# Patient Record
Sex: Female | Born: 1975 | Race: White | Hispanic: No | Marital: Married | Smoking: Never smoker
Health system: Southern US, Community
[De-identification: ages and names within clinical notes are randomized; demographics above are authoritative.]

## PROBLEM LIST (undated history)

## (undated) DIAGNOSIS — Z9889 Other specified postprocedural states: Secondary | ICD-10-CM

## (undated) DIAGNOSIS — D649 Anemia, unspecified: Secondary | ICD-10-CM

## (undated) DIAGNOSIS — K769 Liver disease, unspecified: Secondary | ICD-10-CM

## (undated) DIAGNOSIS — J302 Other seasonal allergic rhinitis: Secondary | ICD-10-CM

## (undated) DIAGNOSIS — F419 Anxiety disorder, unspecified: Secondary | ICD-10-CM

## (undated) DIAGNOSIS — A64 Unspecified sexually transmitted disease: Secondary | ICD-10-CM

## (undated) DIAGNOSIS — E079 Disorder of thyroid, unspecified: Secondary | ICD-10-CM

## (undated) DIAGNOSIS — R112 Nausea with vomiting, unspecified: Secondary | ICD-10-CM

## (undated) DIAGNOSIS — N809 Endometriosis, unspecified: Secondary | ICD-10-CM

## (undated) DIAGNOSIS — Z5189 Encounter for other specified aftercare: Secondary | ICD-10-CM

## (undated) HISTORY — DX: Encounter for other specified aftercare: Z51.89

## (undated) HISTORY — DX: Unspecified sexually transmitted disease: A64

## (undated) HISTORY — DX: Endometriosis, unspecified: N80.9

## (undated) HISTORY — DX: Anxiety disorder, unspecified: F41.9

## (undated) HISTORY — PX: DIAGNOSTIC LAPAROSCOPY: SUR761

## (undated) HISTORY — PX: ABDOMINAL HYSTERECTOMY: SHX81

## (undated) HISTORY — PX: BREAST BIOPSY: SHX20

## (undated) HISTORY — PX: THYROID SURGERY: SHX805

---

## 2005-06-16 ENCOUNTER — Other Ambulatory Visit: Admission: RE | Admit: 2005-06-16 | Discharge: 2005-06-16 | Payer: Self-pay | Admitting: Obstetrics and Gynecology

## 2005-12-27 ENCOUNTER — Ambulatory Visit (HOSPITAL_COMMUNITY): Admission: RE | Admit: 2005-12-27 | Discharge: 2005-12-27 | Payer: Self-pay | Admitting: Obstetrics and Gynecology

## 2006-03-08 ENCOUNTER — Inpatient Hospital Stay (HOSPITAL_COMMUNITY): Admission: AD | Admit: 2006-03-08 | Discharge: 2006-03-10 | Payer: Self-pay | Admitting: Obstetrics and Gynecology

## 2008-07-08 ENCOUNTER — Encounter (INDEPENDENT_AMBULATORY_CARE_PROVIDER_SITE_OTHER): Payer: Self-pay | Admitting: Obstetrics and Gynecology

## 2008-07-08 ENCOUNTER — Ambulatory Visit (HOSPITAL_COMMUNITY): Admission: RE | Admit: 2008-07-08 | Discharge: 2008-07-08 | Payer: Self-pay | Admitting: Obstetrics and Gynecology

## 2008-09-04 ENCOUNTER — Ambulatory Visit: Payer: Self-pay | Admitting: Occupational Medicine

## 2008-09-04 DIAGNOSIS — D509 Iron deficiency anemia, unspecified: Secondary | ICD-10-CM | POA: Insufficient documentation

## 2009-11-19 ENCOUNTER — Encounter: Admission: RE | Admit: 2009-11-19 | Discharge: 2009-11-19 | Payer: Self-pay | Admitting: Obstetrics and Gynecology

## 2009-12-17 ENCOUNTER — Other Ambulatory Visit: Admission: RE | Admit: 2009-12-17 | Discharge: 2009-12-17 | Payer: Self-pay | Admitting: Interventional Radiology

## 2009-12-17 ENCOUNTER — Encounter: Admission: RE | Admit: 2009-12-17 | Discharge: 2009-12-17 | Payer: Self-pay | Admitting: Surgery

## 2010-04-13 ENCOUNTER — Encounter (HOSPITAL_COMMUNITY): Admission: RE | Admit: 2010-04-13 | Discharge: 2010-06-03 | Payer: Self-pay | Admitting: Endocrinology

## 2010-07-13 ENCOUNTER — Ambulatory Visit (HOSPITAL_COMMUNITY): Admission: RE | Admit: 2010-07-13 | Discharge: 2010-07-14 | Payer: Self-pay | Admitting: Surgery

## 2010-11-17 LAB — URINE MICROSCOPIC-ADD ON

## 2010-11-17 LAB — CBC
HCT: 37.3 % (ref 36.0–46.0)
MCHC: 32.8 g/dL (ref 30.0–36.0)
Platelets: 243 10*3/uL (ref 150–400)
RDW: 17.8 % — ABNORMAL HIGH (ref 11.5–15.5)
WBC: 6.5 10*3/uL (ref 4.0–10.5)

## 2010-11-17 LAB — DIFFERENTIAL
Basophils Absolute: 0 10*3/uL (ref 0.0–0.1)
Basophils Relative: 1 % (ref 0–1)
Lymphocytes Relative: 24 % (ref 12–46)
Neutro Abs: 4.1 10*3/uL (ref 1.7–7.7)
Neutrophils Relative %: 63 % (ref 43–77)

## 2010-11-17 LAB — URINALYSIS, ROUTINE W REFLEX MICROSCOPIC
Bilirubin Urine: NEGATIVE
Glucose, UA: NEGATIVE mg/dL
Ketones, ur: NEGATIVE mg/dL
Nitrite: NEGATIVE
Specific Gravity, Urine: 1.023 (ref 1.005–1.030)
pH: 6 (ref 5.0–8.0)

## 2010-11-17 LAB — PROTIME-INR
INR: 1.02 (ref 0.00–1.49)
Prothrombin Time: 13.6 seconds (ref 11.6–15.2)

## 2010-11-17 LAB — SURGICAL PCR SCREEN
MRSA, PCR: NEGATIVE
Staphylococcus aureus: NEGATIVE

## 2010-11-17 LAB — BASIC METABOLIC PANEL
BUN: 18 mg/dL (ref 6–23)
Calcium: 9.5 mg/dL (ref 8.4–10.5)
GFR calc non Af Amer: >60 mL/min (ref >60)
Glucose, Bld: 87 mg/dL (ref 70–99)
Potassium: 4.2 mEq/L (ref 3.5–5.1)

## 2011-01-12 ENCOUNTER — Other Ambulatory Visit: Payer: Self-pay | Admitting: Obstetrics and Gynecology

## 2011-01-18 NOTE — Op Note (Signed)
NAME:  Valerie [name], Valerie [name]              ACCOUNT NO.:  [id number]   MEDICAL RECORD NO.:  [id number]          PATIENT TYPE:  AMB   LOCATION:  SDC                           FACILITY:  [facility]   PHYSICIAN:  Randye Lobo, M.D.   DATE OF BIRTH:  05/15/1976   DATE OF PROCEDURE:  07/08/2008  DATE OF DISCHARGE:                               OPERATIVE REPORT   PREOPERATIVE DIAGNOSES:  1. Dysmenorrhea.  2. Menorrhagia.  3. Complex left ovarian cyst.  4. Desire for permanent sterilization.   POSTOPERATIVE DIAGNOSES:  1. Dysmenorrhea.  2. Menorrhagia.  3. Complex left ovarian cyst.  4. Desire for permanent sterilization.   PROCEDURE:  Hysteroscopy, dilation and curettage, NovaSure endometrial  ablation, laparoscopic left ovarian cystectomy, fulguration of  endometriosis, bilateral tubal ligation using bipolar cautery.   SURGEON:  Randye Lobo, MD   ASSISTANT:  Luvenia Redden, MD   ANESTHESIA:  General endotracheal, local paracervical block with 1%  lidocaine, local 0.25% Marcaine to skin incisions.   IV FLUIDS:  1700 mL of Ringer's lactate.   ESTIMATED BLOOD LOSS:  Minimal.   URINE OUTPUT:  125 mL.   LACTATED RINGERS DEFICIT FOR HYSTEROSCOPY:  100 mL   COMPLICATIONS:  None.   INDICATIONS FOR PROCEDURE:  The patient is a 35 year old gravida 3, para  3 female status post expulsion of Mirena IUD in May 2009, who presented  with heavy and painful menses.  The patient presented to an outside  facility with the above complaints and she was noted to have a  hemoglobin of 7.1 for which she underwent a transfusion of 2 units of  packed red blood cells.  The patient subsequently presented to the  office for further evaluation and treatment.  She had an ultrasound on  June 05, 2008, which documented a normal uterus and right ovary.  There was a complex left ovarian cyst that had no abnormal blood flow by  Doppler studies.  An endometrial biopsy on June 30, 2008, documented  benign  secretory endometrium.  The patient does have a history of  endometriosis by prior laparoscopy and is status post left ovarian  cystectomy for a reported endometrioma.  The patient desires treatment  for her painful and heavy periods, and she wishes to proceed with  NovaSure endometrial ablation, permanent sterilization, and laparoscopy  for evaluation and treatment of potential endometriosis.  Risks,  benefits, and alternatives have been reviewed with the patient.  The  patient was quoted a failure rate of the tubal ligation of approximately  1 in 250 to 1 in 300, which may result in either an intrauterine or an  ectopic pregnancy.  She chooses to proceed.   Hysteroscopy demonstrated an unremarkable intrauterine cavity.  There  was no evidence of any fibroids nor endometrial polyps.   Laparoscopy demonstrated a 2.5-cm left ovarian endometrioma.  The left  ovary was adherent to the left pelvic sidewall.  The right ovary and the  bilateral fallopian tubes were unremarkable.  The uterus was noted to be  slightly enlarged.  The posterior cul-de-sac contained vesicular lesions  of endometriosis covering a  surface area approximately 2 cm in diameter.  There were powder blue lesions of endometriosis along the proximal  uterosacral ligaments bilaterally.  There was also a small Masters  window of the posterior cul-de-sac, which measured approximately 1 cm in  diameter.   In the upper abdomen, there were what appeared to be some chronic or  congenital adhesions around the cecum.  There was no evidence of any  endometriosis in the upper abdomen.  The appendix was normal.  The liver  was unremarkable as was the stomach region.   The gallbladder was not visualized well.   SPECIMENS:  Endometrial curettings were sent to Pathology separately  from the left ovarian cyst.   PROCEDURE:  The patient was re-identified in the preoperative hold area.  The patient did receive Ancef 1 g IV for  antibiotic prophylaxis.  In the  operating room, general endotracheal anesthesia was induced, and the  patient was then placed in the dorsal lithotomy position.  The abdomen  and vagina were sterilely prepped and draped.  A Foley catheter was  placed inside the bladder.  A speculum was placed inside the vagina and  a single-tooth tenaculum was placed on the anterior cervical lip.  A  paracervical block was performed in standard fashion with a total of 10  mL of 1% lidocaine.   The endocervix was sounded and then the endometrial cavity was sounded  to measure for appropriate depths for the NovaSure endometrial ablation.  The cervix was then dilated to a #21 Pratt dilator and the diagnostic  hysteroscope was inserted.  The uterine cavity was noted to be normal.  The tubal ostia were each visualized well.  The diagnostic hysteroscope  was then removed.  The NovaSure device was then placed inside the  uterine cavity.  The NovaSure instrument was seated and was rotated  properly slightly clockwise and counter clockwise and maneuvered  appropriately in order to get the proper width measurement.  The  obturator was placed next to the cervical os and the CO2 test was  performed.  Initially, this did not pass.  A tenaculum was then placed  on the posterior cervical lip, and the obturator was held next to the  cervical os and the operating hand was held down to slightly antevert  the positioning of the NovaSure device.  The patient then passed the CO2  test.  The NovaSure device was then deployed to perform the bipolar  cautery to the endometrium.  The device was then removed without  difficulty.  There were some bleeding noted along the tenaculum site,  which needed to be compressed with a ring forceps.  The Hulka tenaculum  was placed, and the remainder of the procedure was performed  laparoscopically.   A 1-cm umbilical incision was created sharply with a scalpel.  A 10-mm  trocar was  inserted directly into the peritoneal cavity without  difficulty.  The laparoscope confirmed proper placement.  A CO2  pneumoperitoneum was achieved and the patient was placed in the  Trendelenburg position.  A 5-mm incisions were created in the right and  left lower quadrants and 5-mm trocars were placed under direct  visualization of the laparoscope.  An inspection of the pelvic and  abdominal organs was performed and the findings are as noted above.  Blunt dissection was used to remove the left ovary from its adhesions to  the left pelvic sidewall.  The ovary was then grasped and initially a  monopolar scissors was used to  create an incision in the ovarian cortex.  The dissection was then carried down to the central portion of the ovary  where an endometrioma was encountered.  There was chocolaty fluid, which  came out of the cyst.  Using blunt dissection, the endometrioma was  shelled out of the surrounding ovarian tissue, and the specimen was  placed in the cul-de-sac.  The suction irrigator was used to then clean  the bed of the ovary, so that proper cautery could be performed with the  gyrus instrument to create good hemostasis.  The edges of the ovarian  cortex were similarly cauterized with the gyrus instrument.   The ureters were then identified bilaterally and the endometriosis of  the uterosacral ligaments was cauterized with the gyrus instrument after  the ureters were noted to be well out of the region of these areas.  Also, the left ovary needed to be re-cauterized close to the location of  the infundibulopelvic ligament due to some oozing here.  Again the  ureter was noted to be well out of the region of the cautery.  Hemostasis was then good.  Additional cautery needed to be performed  along the left pelvic sidewall where the ovary was adherent to the  peritoneum.  Again, these regions were treated only after the left  ureter was again re-identified.   Hemostasis was  noted to be good at this time.   The left lower quadrant incision was extended to accommodate a 10/11 mm  trocar, which was placed under visualization of the laparoscope.  The  EndoCatch was used to remove the specimen from within the peritoneal  cavity.   Final irrigation and suctioning revealed good hemostasis of the surgical  fields.  A piece of Interceed was then wrapped around the left tube and  ovary and the procedure was completed.  The lower abdominal trocars were  removed under visualization of the laparoscope.  The CO2  pneumoperitoneum was released and the laparoscope and the umbilical  trocar were removed simultaneously.   The left lower quadrant incision was closed along the fascia with a  figure-of-eight suture of 0-Vicryl.  The skin incisions were closed with  subcuticular sutures of 3-0 plain gut suture.  The skin incisions were  closed with Dermabond after injecting with 0.5% Marcaine.   The Hulka was removed from the cervix.  Again, some oozing was noted  from the Hulka tenaculum site and this did respond to compression with a  ring forceps.  The Foley catheter was removed.  The procedure was  completed with hemostasis being good.   The patient was escorted to the recovery room in stable and awake  condition.  There were no complications to the procedure.  All needle,  instrument, and sponge counts were correct.      Randye Lobo, M.D.  Electronically Signed     BES/MEDQ  D:  07/08/2008  T:  07/08/2008  Job:  [id number]

## 2011-05-04 ENCOUNTER — Other Ambulatory Visit: Payer: Self-pay | Admitting: Dermatology

## 2011-06-07 LAB — URINALYSIS, ROUTINE W REFLEX MICROSCOPIC
Glucose, UA: NEGATIVE
Ketones, ur: NEGATIVE
Leukocytes, UA: NEGATIVE
Protein, ur: NEGATIVE
Urobilinogen, UA: 0.2

## 2011-06-07 LAB — PREGNANCY, URINE: Preg Test, Ur: NEGATIVE

## 2011-06-07 LAB — CBC
Hemoglobin: 11.4 — ABNORMAL LOW
MCHC: 32.1
Platelets: 254
RDW: 33 — ABNORMAL HIGH

## 2011-06-07 LAB — URINE MICROSCOPIC-ADD ON

## 2011-09-06 DIAGNOSIS — Z5189 Encounter for other specified aftercare: Secondary | ICD-10-CM

## 2011-09-06 HISTORY — DX: Encounter for other specified aftercare: Z51.89

## 2011-10-09 IMAGING — CR DG CHEST 2V
2 series · 2 of 2 positions shown · non-contrast
Comparison: None.

CLINICAL DATA: Thyroid nodule.  Preop respiratory exam.

CHEST - 2 VIEW

[w chest pa]
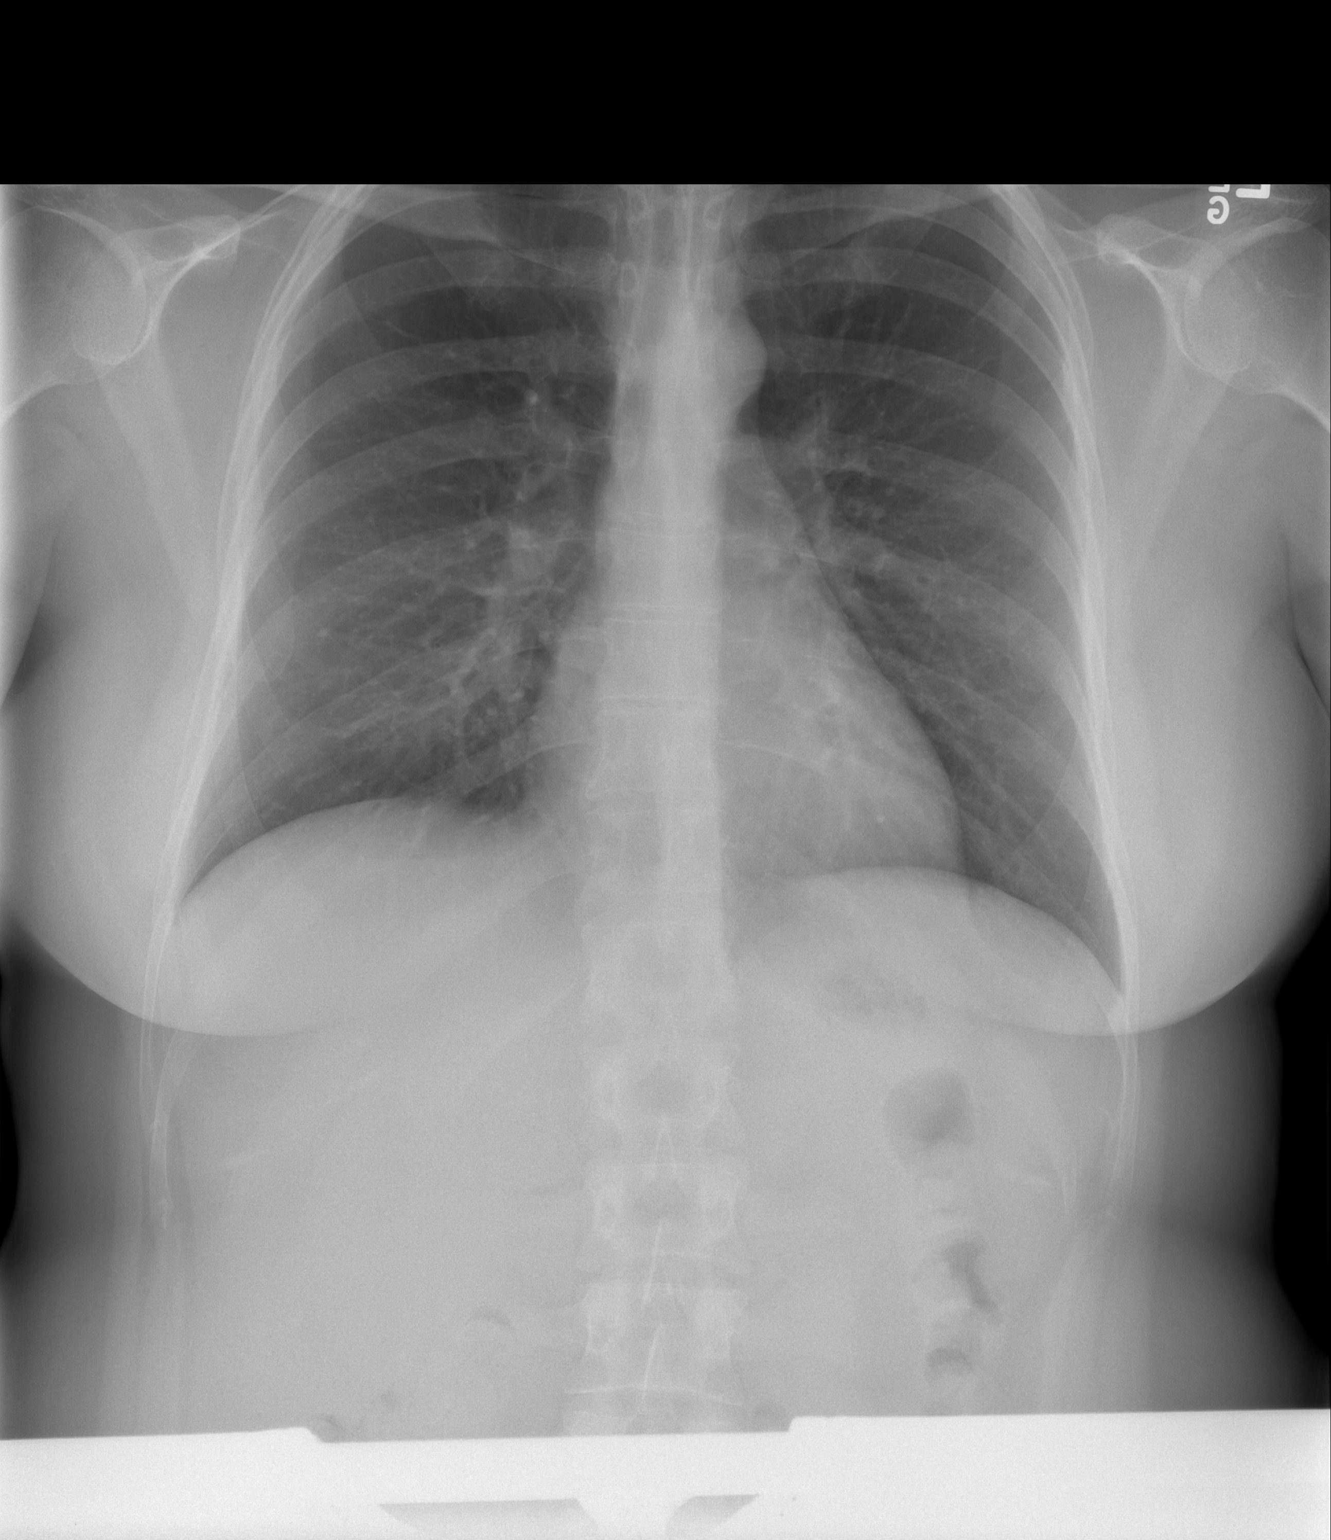

[w chest lat]
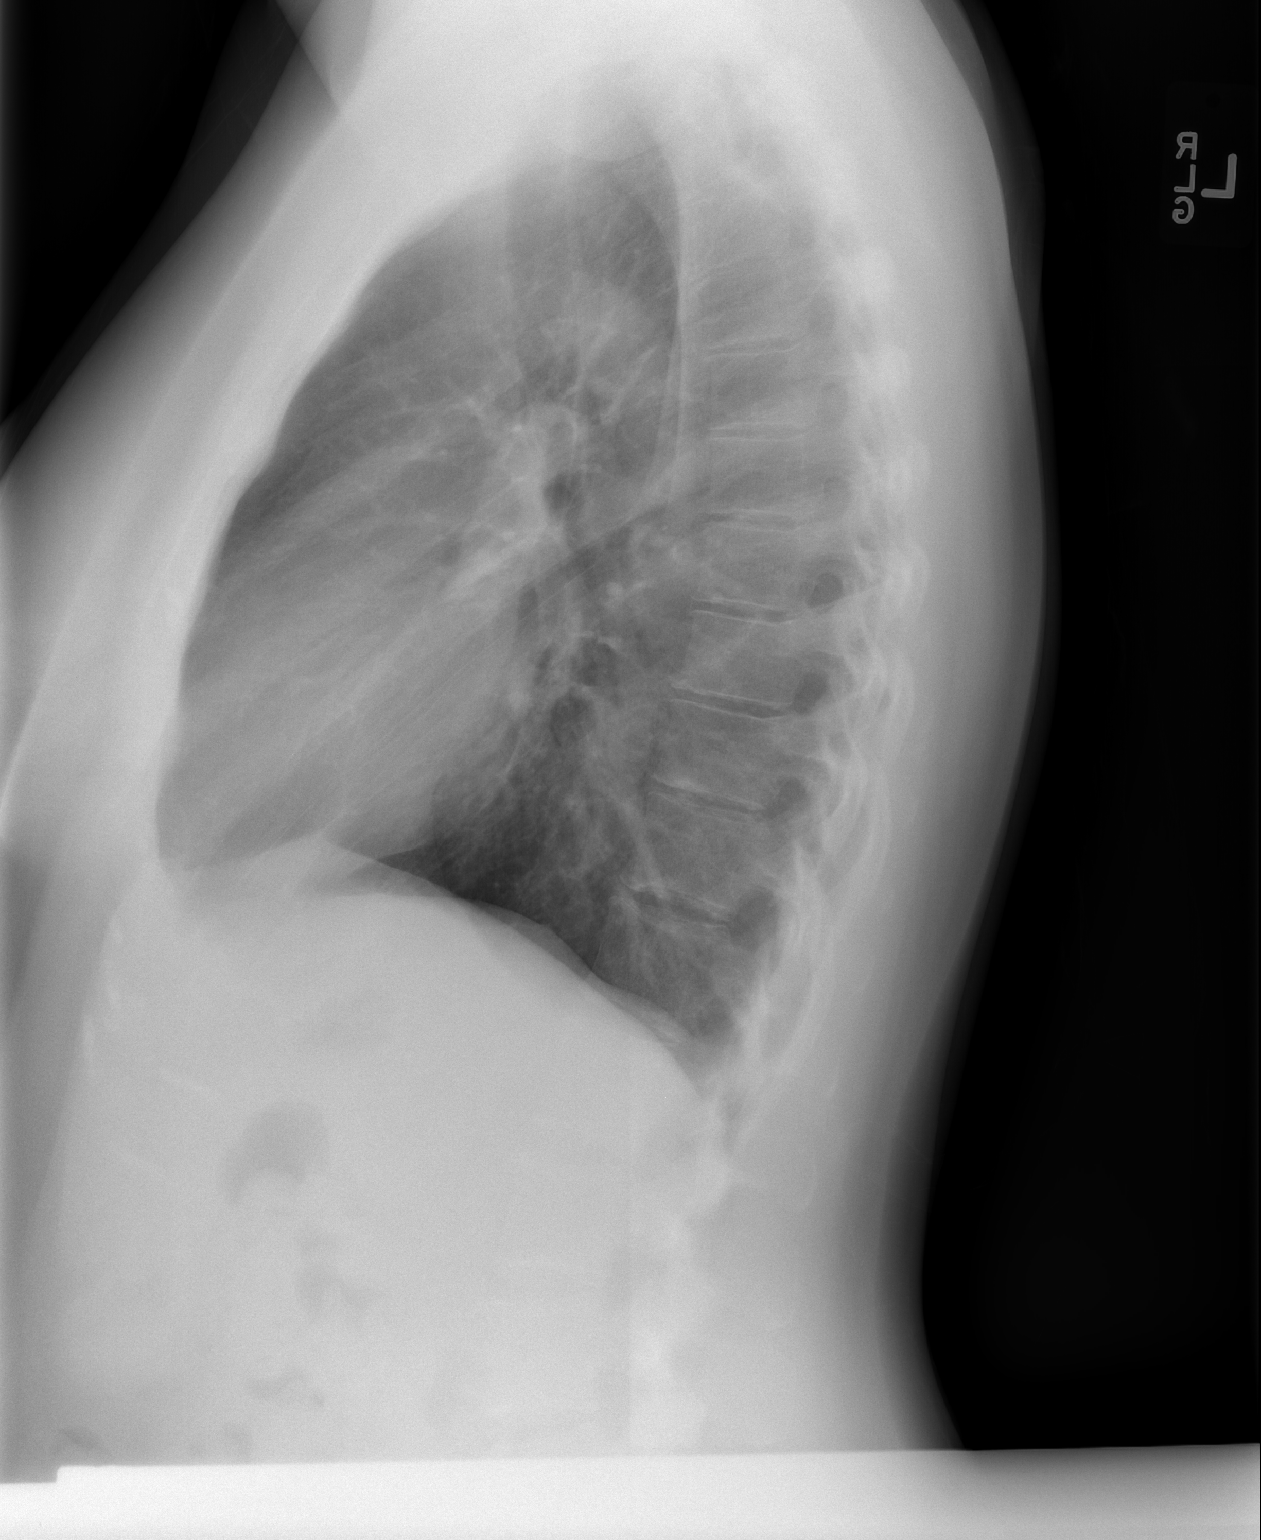

[2 of 2 positions shown; findings below may reference images not displayed]

FINDINGS: The heart size and mediastinal contours are within
normal limits.  Both lungs are clear.  The visualized skeletal
structures are unremarkable.
IMPRESSION: No active cardiopulmonary disease.

## 2012-03-23 ENCOUNTER — Other Ambulatory Visit: Payer: Self-pay | Admitting: Obstetrics and Gynecology

## 2012-04-18 ENCOUNTER — Encounter (HOSPITAL_COMMUNITY): Payer: Self-pay | Admitting: Pharmacist

## 2012-04-20 ENCOUNTER — Other Ambulatory Visit: Payer: Self-pay | Admitting: Obstetrics and Gynecology

## 2012-04-27 ENCOUNTER — Encounter (HOSPITAL_COMMUNITY)
Admission: RE | Admit: 2012-04-27 | Discharge: 2012-04-27 | Disposition: A | Discharge status: Home / Self Care | Payer: Managed Care, Other (non HMO) | Source: Ambulatory Visit | Attending: Obstetrics and Gynecology | Admitting: Obstetrics and Gynecology

## 2012-04-27 ENCOUNTER — Encounter (HOSPITAL_COMMUNITY): Payer: Self-pay

## 2012-04-27 HISTORY — DX: Nausea with vomiting, unspecified: R11.2

## 2012-04-27 HISTORY — DX: Other specified postprocedural states: Z98.890

## 2012-04-27 HISTORY — DX: Anemia, unspecified: D64.9

## 2012-04-27 HISTORY — DX: Liver disease, unspecified: K76.9

## 2012-04-27 LAB — SURGICAL PCR SCREEN
MRSA, PCR: INVALID — AB
Staphylococcus aureus: INVALID — AB

## 2012-04-27 LAB — CBC
MCH: 29.7 pg (ref 26.0–34.0)
MCHC: 32.4 g/dL (ref 30.0–36.0)
MCV: 91.6 fL (ref 78.0–100.0)
Platelets: 189 10*3/uL (ref 150–400)
RBC: 4.41 MIL/uL (ref 3.87–5.11)

## 2012-04-27 NOTE — Patient Instructions (Addendum)
20 Valerie [name]  04/27/2012   Your procedure is scheduled on:  05/03/12  Enter through the Main Entrance of [facility] at 1030 AM.  Pick up the phone at the desk and dial [phone].   Call this number if you have problems the morning of surgery: [phone]   Remember:   Do not eat food:After Midnight.  Do not drink clear liquids: After Midnight.  Take these medicines the morning of surgery with A SIP OF WATER: Thyroid medication   Do not wear jewelry, make-up or nail polish.  Do not wear lotions, powders, or perfumes. You may wear deodorant.  Do not shave 48 hours prior to surgery.  Do not bring valuables to the hospital.  Contacts, dentures or bridgework may not be worn into surgery.  Leave suitcase in the car. After surgery it may be brought to your room.  For patients admitted to the hospital, checkout time is 11:00 AM the day of discharge.   Patients discharged the day of surgery will not be allowed to drive home.  Name and phone number of your driver: NA  Special Instructions: CHG Shower Use Special Wash: 1/2 bottle night before surgery and 1/2 bottle morning of surgery.   Please read over the following fact sheets that you were given: MRSA Information

## 2012-04-30 LAB — MRSA CULTURE

## 2012-05-02 NOTE — H&P (Signed)
36 y.o. yo complains of continued pelvic pain and menorrhagia.  Pt is s/p BTL/novasure and continues to have constant pain R & L LQ.  She also has bleeding for two weeks at a time, 2 days of which are flooding and changing clothes.  She had been transfused with iron in February.  She has tried the [place], Depo, and Novasure without relief.  She does not complain of SUI.  She has a history of endometriosis and possible adenomyosis.    EMB had been negative.  Past Medical History  Diagnosis Date  . PONV (postoperative nausea and vomiting)   . Liver disorder     fatty liver disease  . Anemia     Hx blood transfusion 3-36yrs ago and iron infusion last month 03/2012   Past Surgical History  Procedure Date  . Thyroid surgery     removed  . Diagnostic laparoscopy     History   Social History  . Marital Status: Married    Spouse Name: N/A    Number of Children: N/A  . Years of Education: N/A   Occupational History  . Not on file.   Social History Main Topics  . Smoking status: Never Smoker   . Smokeless tobacco: Not on file  . Alcohol Use: Yes     rarely  . Drug Use: No  . Sexually Active:    Other Topics Concern  . Not on file   Social History Narrative  . No narrative on file    No current facility-administered medications on file prior to encounter.   Current Outpatient Prescriptions on File Prior to Encounter  Medication Sig Dispense Refill  . levothyroxine (SYNTHROID, LEVOTHROID) 88 MCG tablet Take 88 mcg by mouth daily.        Allergies  Allergen Reactions  . Vicodin (Hydrocodone-Acetaminophen) Nausea And Vomiting    Patient can tolerate percocet    @VITALS2 @  Lungs: clear to ascultation Cor:  RRR Abdomen:  soft, nontender, nondistended. Ex:  no cords, erythema Pelvic:  10 weeks size uterus; not other masses felt.  U/S  9.5x6.5x6.4, EM 1.4 cm; ovaries normal.  Adenomyosis appearance of uterus.  A:  For Robo TLH/RSO (pain worse on right.) possible LSO for  adenomyosis and menorrhagia; R&L LQ pain.   P:All risks, benefits and alternatives d/w patient and she desires to proceed.  Patient has undergone a modified bowel prep and will receive preop antibiotics and SCDs during the operation.     Tong Pieczynski A

## 2012-05-03 ENCOUNTER — Encounter (HOSPITAL_COMMUNITY): Payer: Self-pay | Admitting: Anesthesiology

## 2012-05-03 ENCOUNTER — Ambulatory Visit (HOSPITAL_COMMUNITY)
Admission: RE | Admit: 2012-05-03 | Discharge: 2012-05-04 | Disposition: A | Discharge status: Home / Self Care | Payer: Managed Care, Other (non HMO) | Source: Ambulatory Visit | Attending: Obstetrics and Gynecology | Admitting: Obstetrics and Gynecology

## 2012-05-03 ENCOUNTER — Encounter (HOSPITAL_COMMUNITY)
Admission: RE | Disposition: A | Discharge status: Home / Self Care | Payer: Self-pay | Source: Ambulatory Visit | Attending: Obstetrics and Gynecology

## 2012-05-03 ENCOUNTER — Ambulatory Visit (HOSPITAL_COMMUNITY): Payer: Managed Care, Other (non HMO) | Admitting: Anesthesiology

## 2012-05-03 DIAGNOSIS — N8300 Follicular cyst of ovary, unspecified side: Secondary | ICD-10-CM | POA: Insufficient documentation

## 2012-05-03 DIAGNOSIS — N949 Unspecified condition associated with female genital organs and menstrual cycle: Secondary | ICD-10-CM | POA: Insufficient documentation

## 2012-05-03 DIAGNOSIS — Z9889 Other specified postprocedural states: Secondary | ICD-10-CM

## 2012-05-03 DIAGNOSIS — N8000 Endometriosis of the uterus, unspecified: Secondary | ICD-10-CM | POA: Insufficient documentation

## 2012-05-03 DIAGNOSIS — R1031 Right lower quadrant pain: Secondary | ICD-10-CM | POA: Insufficient documentation

## 2012-05-03 DIAGNOSIS — N920 Excessive and frequent menstruation with regular cycle: Secondary | ICD-10-CM | POA: Insufficient documentation

## 2012-05-03 DIAGNOSIS — R1032 Left lower quadrant pain: Secondary | ICD-10-CM | POA: Insufficient documentation

## 2012-05-03 HISTORY — PX: CYSTOSCOPY: SHX5120

## 2012-05-03 HISTORY — PX: SALPINGOOPHORECTOMY: SHX82

## 2012-05-03 LAB — HEPATIC FUNCTION PANEL
AST: 47 U/L — ABNORMAL HIGH (ref 0–37)
Albumin: 4.6 g/dL (ref 3.5–5.2)
Total Bilirubin: 0.6 mg/dL (ref 0.3–1.2)

## 2012-05-03 LAB — PREGNANCY, URINE: Preg Test, Ur: NEGATIVE

## 2012-05-03 SURGERY — ROBOTIC ASSISTED TOTAL HYSTERECTOMY
Anesthesia: General | Wound class: Clean Contaminated

## 2012-05-03 MED ORDER — FENTANYL CITRATE 0.05 MG/ML IJ SOLN
INTRAMUSCULAR | Status: DC | PRN
  Administered 2012-05-03 (×2): 50 ug via INTRAVENOUS
  Administered 2012-05-03: 100 ug via INTRAVENOUS
  Administered 2012-05-03: 50 ug via INTRAVENOUS
  Administered 2012-05-03: 100 ug via INTRAVENOUS
  Administered 2012-05-03: 50 ug via INTRAVENOUS

## 2012-05-03 MED ORDER — CEFAZOLIN SODIUM-DEXTROSE 2-3 GM-% IV SOLR
2.0000 g | INTRAVENOUS | Status: AC
  Administered 2012-05-03: 2 g via INTRAVENOUS

## 2012-05-03 MED ORDER — ROCURONIUM BROMIDE 100 MG/10ML IV SOLN
INTRAVENOUS | Status: DC | PRN
  Administered 2012-05-03 (×3): 10 mg via INTRAVENOUS
  Administered 2012-05-03: 40 mg via INTRAVENOUS

## 2012-05-03 MED ORDER — LORATADINE 10 MG PO TABS
10.0000 mg | ORAL_TABLET | Freq: Every day | ORAL | Status: DC
  Filled 2012-05-03: qty 1

## 2012-05-03 MED ORDER — LACTATED RINGERS IR SOLN
Status: DC | PRN
  Administered 2012-05-03: 3000 mL

## 2012-05-03 MED ORDER — DEXAMETHASONE SODIUM PHOSPHATE 4 MG/ML IJ SOLN
INTRAMUSCULAR | Status: DC | PRN
  Administered 2012-05-03: 10 mg via INTRAVENOUS

## 2012-05-03 MED ORDER — LACTATED RINGERS IV SOLN
INTRAVENOUS | Status: DC
  Administered 2012-05-03 (×2): via INTRAVENOUS

## 2012-05-03 MED ORDER — MENTHOL 3 MG MT LOZG: 1.0000 | LOZENGE | OROMUCOSAL | Status: DC | PRN

## 2012-05-03 MED ORDER — ONDANSETRON HCL 4 MG/2ML IJ SOLN
INTRAMUSCULAR | Status: AC
  Filled 2012-05-03: qty 2

## 2012-05-03 MED ORDER — LIDOCAINE HCL (CARDIAC) 20 MG/ML IV SOLN
INTRAVENOUS | Status: DC | PRN
  Administered 2012-05-03: 60 mg via INTRAVENOUS

## 2012-05-03 MED ORDER — DEXAMETHASONE SODIUM PHOSPHATE 10 MG/ML IJ SOLN
INTRAMUSCULAR | Status: AC
  Filled 2012-05-03: qty 1

## 2012-05-03 MED ORDER — HYDROMORPHONE HCL PF 1 MG/ML IJ SOLN
0.2500 mg | INTRAMUSCULAR | Status: DC | PRN
  Administered 2012-05-03: 0.5 mg via INTRAVENOUS

## 2012-05-03 MED ORDER — OXYCODONE-ACETAMINOPHEN 5-325 MG PO TABS
1.0000 | ORAL_TABLET | ORAL | Status: DC | PRN
  Administered 2012-05-03 – 2012-05-04 (×5): 1 via ORAL
  Filled 2012-05-03: qty 1
  Filled 2012-05-03: qty 2
  Filled 2012-05-03: qty 1
  Filled 2012-05-03: qty 2
  Filled 2012-05-03: qty 1

## 2012-05-03 MED ORDER — PROPOFOL 10 MG/ML IV EMUL
INTRAVENOUS | Status: AC
  Filled 2012-05-03: qty 20

## 2012-05-03 MED ORDER — INDIGOTINDISULFONATE SODIUM 8 MG/ML IJ SOLN
INTRAMUSCULAR | Status: DC | PRN
  Administered 2012-05-03: 5 mL via INTRAVENOUS

## 2012-05-03 MED ORDER — IBUPROFEN 800 MG PO TABS
800.0000 mg | ORAL_TABLET | Freq: Three times a day (TID) | ORAL | Status: DC | PRN
  Filled 2012-05-03: qty 1

## 2012-05-03 MED ORDER — PROPOFOL 10 MG/ML IV EMUL
INTRAVENOUS | Status: DC | PRN
  Administered 2012-05-03: 160 mg via INTRAVENOUS

## 2012-05-03 MED ORDER — LEVOTHYROXINE SODIUM 88 MCG PO TABS
88.0000 ug | ORAL_TABLET | Freq: Every day | ORAL | Status: DC
  Filled 2012-05-03 (×2): qty 1

## 2012-05-03 MED ORDER — HYDROMORPHONE HCL PF 1 MG/ML IJ SOLN
INTRAMUSCULAR | Status: AC
  Filled 2012-05-03: qty 1

## 2012-05-03 MED ORDER — ARTIFICIAL TEARS OP OINT
TOPICAL_OINTMENT | OPHTHALMIC | Status: AC
  Filled 2012-05-03: qty 3.5

## 2012-05-03 MED ORDER — NEOSTIGMINE METHYLSULFATE 1 MG/ML IJ SOLN
INTRAMUSCULAR | Status: AC
  Filled 2012-05-03: qty 10

## 2012-05-03 MED ORDER — KETOROLAC TROMETHAMINE 30 MG/ML IJ SOLN
30.0000 mg | Freq: Four times a day (QID) | INTRAMUSCULAR | Status: DC
  Administered 2012-05-03: 30 mg via INTRAVENOUS

## 2012-05-03 MED ORDER — ONDANSETRON HCL 4 MG/2ML IJ SOLN
INTRAMUSCULAR | Status: DC | PRN
  Administered 2012-05-03: 4 mg via INTRAVENOUS

## 2012-05-03 MED ORDER — ZOLPIDEM TARTRATE 5 MG PO TABS: 5.0000 mg | ORAL_TABLET | Freq: Every evening | ORAL | Status: DC | PRN

## 2012-05-03 MED ORDER — GLYCOPYRROLATE 0.2 MG/ML IJ SOLN
INTRAMUSCULAR | Status: AC
  Filled 2012-05-03: qty 1

## 2012-05-03 MED ORDER — MIDAZOLAM HCL 2 MG/2ML IJ SOLN
INTRAMUSCULAR | Status: AC
  Filled 2012-05-03: qty 2

## 2012-05-03 MED ORDER — FENTANYL CITRATE 0.05 MG/ML IJ SOLN
INTRAMUSCULAR | Status: AC
  Filled 2012-05-03: qty 5

## 2012-05-03 MED ORDER — KETOROLAC TROMETHAMINE 30 MG/ML IJ SOLN
INTRAMUSCULAR | Status: AC
  Filled 2012-05-03: qty 1

## 2012-05-03 MED ORDER — ONDANSETRON HCL 4 MG PO TABS: 4.0000 mg | ORAL_TABLET | Freq: Four times a day (QID) | ORAL | Status: DC | PRN

## 2012-05-03 MED ORDER — ATROPINE SULFATE 0.4 MG/ML IJ SOLN
INTRAMUSCULAR | Status: DC | PRN
  Administered 2012-05-03: 0.4 mg via INTRAVENOUS

## 2012-05-03 MED ORDER — ONDANSETRON HCL 4 MG/2ML IJ SOLN
4.0000 mg | Freq: Four times a day (QID) | INTRAMUSCULAR | Status: DC | PRN
  Administered 2012-05-03: 4 mg via INTRAVENOUS
  Filled 2012-05-03: qty 2

## 2012-05-03 MED ORDER — GLYCOPYRROLATE 0.2 MG/ML IJ SOLN
INTRAMUSCULAR | Status: DC | PRN
  Administered 2012-05-03: 0.2 mg via INTRAVENOUS

## 2012-05-03 MED ORDER — BUTORPHANOL TARTRATE 1 MG/ML IJ SOLN
2.0000 mg | INTRAMUSCULAR | Status: DC | PRN
Start: 2012-05-03 — End: 2012-05-04
  Filled 2012-05-03: qty 2

## 2012-05-03 MED ORDER — ROCURONIUM BROMIDE 50 MG/5ML IV SOLN
INTRAVENOUS | Status: AC
  Filled 2012-05-03: qty 2

## 2012-05-03 MED ORDER — NEOSTIGMINE METHYLSULFATE 1 MG/ML IJ SOLN
INTRAMUSCULAR | Status: DC | PRN
  Administered 2012-05-03: 5 mg via INTRAVENOUS

## 2012-05-03 MED ORDER — MIDAZOLAM HCL 5 MG/5ML IJ SOLN
INTRAMUSCULAR | Status: DC | PRN
  Administered 2012-05-03: 2 mg via INTRAVENOUS

## 2012-05-03 MED ORDER — LIDOCAINE HCL (CARDIAC) 20 MG/ML IV SOLN
INTRAVENOUS | Status: AC
Start: 2012-05-03 — End: 2012-05-03
  Filled 2012-05-03: qty 5

## 2012-05-03 MED ORDER — CEFAZOLIN SODIUM-DEXTROSE 2-3 GM-% IV SOLR
INTRAVENOUS | Status: AC
  Filled 2012-05-03: qty 50

## 2012-05-03 SURGICAL SUPPLY — 76 items
ADH SKN CLS APL DERMABOND .7 (GAUZE/BANDAGES/DRESSINGS) ×2
APL SKNCLS STERI-STRIP NONHPOA (GAUZE/BANDAGES/DRESSINGS) ×2
BAG URINE DRAINAGE (UROLOGICAL SUPPLIES) ×3 IMPLANT
BARRIER ADHS 3X4 INTERCEED (GAUZE/BANDAGES/DRESSINGS) ×3 IMPLANT
BENZOIN TINCTURE PRP APPL 2/3 (GAUZE/BANDAGES/DRESSINGS) ×3 IMPLANT
BRR ADH 4X3 ABS CNTRL BYND (GAUZE/BANDAGES/DRESSINGS) ×2
CABLE HIGH FREQUENCY MONO STRZ (ELECTRODE) ×3 IMPLANT
CATH FOLEY 3WAY  5CC 16FR (CATHETERS) ×1
CATH FOLEY 3WAY 5CC 16FR (CATHETERS) ×2 IMPLANT
CHLORAPREP W/TINT 26ML (MISCELLANEOUS) ×3 IMPLANT
CLOTH BEACON ORANGE TIMEOUT ST (SAFETY) ×3 IMPLANT
CONT PATH 16OZ SNAP LID 3702 (MISCELLANEOUS) ×3 IMPLANT
COVER MAYO STAND STRL (DRAPES) ×3 IMPLANT
COVER TABLE BACK 60X90 (DRAPES) ×6 IMPLANT
COVER TIP SHEARS 8 DVNC (MISCELLANEOUS) ×2 IMPLANT
COVER TIP SHEARS 8MM DA VINCI (MISCELLANEOUS) ×1
DECANTER SPIKE VIAL GLASS SM (MISCELLANEOUS) ×3 IMPLANT
DERMABOND ADVANCED (GAUZE/BANDAGES/DRESSINGS) ×1
DERMABOND ADVANCED .7 DNX12 (GAUZE/BANDAGES/DRESSINGS) ×2 IMPLANT
DRAPE HUG U DISPOSABLE (DRAPE) ×3 IMPLANT
DRAPE LG THREE QUARTER DISP (DRAPES) ×6 IMPLANT
DRAPE MONITOR DA VINCI (DRAPE) IMPLANT
DRAPE WARM FLUID 44X44 (DRAPE) ×3 IMPLANT
ELECT REM PT RETURN 9FT ADLT (ELECTROSURGICAL) ×3
ELECTRODE REM PT RTRN 9FT ADLT (ELECTROSURGICAL) ×2 IMPLANT
EVACUATOR SMOKE 8.L (FILTER) ×3 IMPLANT
GAUZE VASELINE 3X9 (GAUZE/BANDAGES/DRESSINGS) IMPLANT
GLOVE BIO SURGEON STRL SZ 6.5 (GLOVE) ×3 IMPLANT
GLOVE BIO SURGEON STRL SZ7 (GLOVE) ×9 IMPLANT
GLOVE BIOGEL PI IND STRL 6.5 (GLOVE) ×2 IMPLANT
GLOVE BIOGEL PI INDICATOR 6.5 (GLOVE) ×1
GLOVE ECLIPSE 6.5 STRL STRAW (GLOVE) ×9 IMPLANT
GOWN STRL REIN XL XLG (GOWN DISPOSABLE) ×18 IMPLANT
GYRUS RUMI II 2.5CM BLUE (DISPOSABLE)
GYRUS RUMI II 3.5CM BLUE (DISPOSABLE)
GYRUS RUMI II 4.0CM BLUE (DISPOSABLE)
KIT ACCESSORY DA VINCI DISP (KITS) ×1
KIT ACCESSORY DVNC DISP (KITS) ×2 IMPLANT
KIT DISP ACCESSORY 4 ARM (KITS) IMPLANT
MANIPULATOR UTERINE 4.5 ZUMI (MISCELLANEOUS) IMPLANT
NDL INSUFFLATION 14GA 120MM (NEEDLE) ×2 IMPLANT
NEEDLE INSUFFLATION 14GA 120MM (NEEDLE) ×3 IMPLANT
OCCLUDER COLPOPNEUMO (BALLOONS) ×6 IMPLANT
PACK LAVH (CUSTOM PROCEDURE TRAY) ×3 IMPLANT
PAD PREP 24X48 CUFFED NSTRL (MISCELLANEOUS) ×6 IMPLANT
PLUG CATH AND CAP STER (CATHETERS) ×3 IMPLANT
PROTECTOR NERVE ULNAR (MISCELLANEOUS) ×6 IMPLANT
RUMI II 3.0CM BLUE KOH-EFFICIE (DISPOSABLE) IMPLANT
RUMI II GYRUS 2.5CM BLUE (DISPOSABLE) IMPLANT
RUMI II GYRUS 3.5CM BLUE (DISPOSABLE) IMPLANT
RUMI II GYRUS 4.0CM BLUE (DISPOSABLE) IMPLANT
SET CYSTO W/LG BORE CLAMP LF (SET/KITS/TRAYS/PACK) IMPLANT
SET IRRIG TUBING LAPAROSCOPIC (IRRIGATION / IRRIGATOR) ×3 IMPLANT
SOLUTION ELECTROLUBE (MISCELLANEOUS) ×3 IMPLANT
SPONGE LAP 18X18 X RAY DECT (DISPOSABLE) IMPLANT
STRIP CLOSURE SKIN 1/2X4 (GAUZE/BANDAGES/DRESSINGS) ×3 IMPLANT
SURGIFLO W/THROMBIN 8M KIT (HEMOSTASIS) ×1 IMPLANT
SUT VIC AB 0 CT1 27 (SUTURE) ×15
SUT VIC AB 0 CT1 27XBRD ANBCTR (SUTURE) ×10 IMPLANT
SUT VIC AB 2-0 CT2 27 (SUTURE) ×6 IMPLANT
SUT VICRYL 0 UR6 27IN ABS (SUTURE) ×6 IMPLANT
SUT VICRYL RAPIDE 3 0 (SUTURE) ×6 IMPLANT
SYR 50ML LL SCALE MARK (SYRINGE) ×3 IMPLANT
SYSTEM CONVERTIBLE TROCAR (TROCAR) IMPLANT
TIP UTERINE 5.1X6CM LAV DISP (MISCELLANEOUS) IMPLANT
TIP UTERINE 6.7X10CM GRN DISP (MISCELLANEOUS) IMPLANT
TIP UTERINE 6.7X6CM WHT DISP (MISCELLANEOUS) IMPLANT
TIP UTERINE 6.7X8CM BLUE DISP (MISCELLANEOUS) IMPLANT
TOWEL OR 17X24 6PK STRL BLUE (TOWEL DISPOSABLE) ×6 IMPLANT
TROCAR DISP BLADELESS 8 DVNC (TROCAR) ×2 IMPLANT
TROCAR DISP BLADELESS 8MM (TROCAR) ×1
TROCAR XCEL 12X100 BLDLESS (ENDOMECHANICALS) ×3 IMPLANT
TROCAR XCEL NON-BLD 5MMX100MML (ENDOMECHANICALS) ×6 IMPLANT
TROCAR Z-THREAD 12X150 (TROCAR) IMPLANT
TUBING FILTER THERMOFLATOR (ELECTROSURGICAL) ×3 IMPLANT
WATER STERILE IRR 1000ML POUR (IV SOLUTION) ×9 IMPLANT

## 2012-05-03 NOTE — Preoperative (Signed)
Beta Blockers   Reason not to administer Beta Blockers:Not Applicable 

## 2012-05-03 NOTE — Brief Op Note (Signed)
05/03/2012  2:06 PM  PATIENT:  Valerie [name]  36 y.o. female  PRE-OPERATIVE DIAGNOSIS:  Menometrorrhagia  POST-OPERATIVE DIAGNOSIS:  Menometrorrhagia  PROCEDURE:  Procedure(s) (LRB): ROBOTIC ASSISTED TOTAL HYSTERECTOMY (N/A) R SALPINGO OOPHERECTOMY, L SALPINGECTOMY CYSTOSCOPY (N/A)  SURGEON:  Surgeon(s) and Role:    * Loney Laurence, MD - Primary    * W Lodema Hong, MD - Assisting   ANESTHESIA:   general  EBL:  Total I/O In: 1000 [I.V.:1000] Out: 150 [Urine:150]  SPECIMEN:  Source of Specimen:  uterus, cervix, R ovary, bilateral tubes  DISPOSITION OF SPECIMEN:  PATHOLOGY  COUNTS:  YES  PLAN OF CARE: Admit for overnight observation  PATIENT DISPOSITION:  PACU - hemodynamically stable.   Delay start of Pharmacological VTE agent (>24hrs) due to surgical blood loss or risk of bleeding: not applicable  Complications:  None.  Findings:  10 weeks size boggy uterus, weighing 305 gm.  Ovaries were normal bilaterally, though left was much smaller than right.  There was endometriosis on L tube.  The cul de sac appeared normal. The ureters were identified during multiple points of the case and were always out of the field of dissection.  On cystoscopy, the bladder was intact and bilateral spill was seen from each ureteral orriface.  There were adhesions of omentum to upper anterior abdominal wall which were left in place.  The appendix and liver edge were normal.  Medications:  Ancef.  Indigo carmine.  Surgiflo.    Technique:  After adequate anesthesia was achieved the patient was positioned, prepped and draped in usual sterile fashion.  A speculum was placed in the vagina and the cervix dilated with pratt dilators.  The 8 cm Rumi and 3 cm Koh ring were assembled and placed in proper fashion.  The  Speculum was removed and the bladder catheterized with a foley.    Attention was turned to the abdomen where a 1 cm incision was made above the umbilicus.  The veress needle was  inserted without aspiration of bowel contents or blood.  The long 12 mm trocar was placed and the other three trocar sites were marked out, all approximately 10 cm from each other and the camera.  Two 8.5 mm trocars were placed on either side of the camera port.  A 5 mm assistant port was placed 3 cm above the left iliac crest.  All trocars were inserted under direct visualization of the camera.  The patient was placed in trendelenburg and then the Robot docked.  The PK forceps were placed on arm 2 and the Hot shears on arm 1 and introduced under direct visualization of the camera    I then broke scrub and sat down at the console.  The above findings were noted and the ureters identified well out of the field of dissection.  The round ligament on the right was cauterized with the PK and divided with the shears.  The right IP ligament was then divided with the PK cautery and shears.  The posterior broad ligament was then divided with the hot shears until the uterosacral ligament.  The Broad and cardinal ligaments were then cauterized against the cervix to the level of the Koh ring, securing the uterine artery.  Each pedicle was then incised with the shears.  The anterior leaf was then incised at the reflection of the vessico-uterine junction and the lateral bladder retracted inferiorly after the round ligament on the left had been divided with the PK forceps.  The left tube  was cauterized with the PK and divided with the shears;  then the left utero-ovarian ligament divided with the PK forceps and the scissors.  The left broad ligament and cardinal ligaments were incised in the same way as the right.  The vessels were very big and required multiple applications of cautery.  The ureters were confirmed well away from the field of dissection at all times.   At the level of the internal os, the uterine arteries were bilaterally cauterized with the PK.  The ureters were again identified well out of the field of  dissection.  .   The bladder was then able to be retracted inferiorly and the vesico-uterine fascia was incised in the midline until the bladder was removed one cm below the Koh ring.  The hot shears then circumferentially incised the vagina at the level of the reflection on the [facility] ring.  Once the uterus and cervix were amputated, cautery was used to insure hemostasis of the cuff.  Once hemostasis was achieved, the instruments were changed to the long forceps and mega suture cut needle driver and the cuff was closed with a running stitches of 0-vicryl V loc.  Cautery was used to ensure hemostasis of the left pedicles very superficially after a 2-0 vicryl suture was attempted to help hemostasis.  The ureters were peristalsing bilaterally well and very lateral to the areas of operation.    The Robot was then undocked and I scrubbed back in.   There was some oozing still on the bladder flap, and because she had a slightly higher than normal bleeding rate from her skin incisions, I elected to add Surgiflo to the area of the bladder flap and vaginal cuff to ensure hemostasis.  All instruments were removed from the abdomen and the abdomen desufflated. The skin incisions were closed with subcuticular stitches of 3-0 vicryl Rapide and Dermabond.  All instruments were removed from the vagina and cystoscopy performed, revealing an intact bladder and vigourous spill of indigo carmine from each ureteral orifice.  The cystoscope was removed and the patient taken to the recovery room in stable condition.  [name] A

## 2012-05-03 NOTE — Anesthesia Procedure Notes (Signed)
Procedure Name: Intubation Date/Time: 05/03/2012 11:48 AM Performed by: Isabella Bowens R Pre-anesthesia Checklist: Patient identified, Patient being monitored, Timeout performed, Emergency Drugs available and Suction available Patient Re-evaluated:Patient Re-evaluated prior to inductionOxygen Delivery Method: Circle System Utilized Preoxygenation: Pre-oxygenation with 100% oxygen Intubation Type: IV induction Ventilation: Mask ventilation without difficulty Laryngoscope Size: Mac and 3 Grade View: Grade II Tube type: Oral Tube size: 7.0 mm Number of attempts: 1 Airway Equipment and Method: stylet Placement Confirmation: ETT inserted through vocal cords under direct vision,  positive ETCO2 and breath sounds checked- equal and bilateral Secured at: 21 cm Tube secured with: Tape Dental Injury: Teeth and Oropharynx as per pre-operative assessment

## 2012-05-03 NOTE — Anesthesia Preprocedure Evaluation (Addendum)
Anesthesia Evaluation  Patient identified by MRN, date of birth, ID band Patient awake    Reviewed: Allergy & Precautions, H&P , Patient's Chart, lab work & pertinent test results, reviewed documented beta blocker date and time   History of Anesthesia Complications (+) PONV  Airway Mallampati: II TM Distance: >3 FB Neck ROM: full    Dental No notable dental hx.    Pulmonary  breath sounds clear to auscultation  Pulmonary exam normal       Cardiovascular Rhythm:regular Rate:Normal     Neuro/Psych    GI/Hepatic Hx of Fatty Liver Dz. Will check LFT's   Endo/Other  Hypothyroidism   Renal/GU      Musculoskeletal   Abdominal   Peds  Hematology   Anesthesia Other Findings   Reproductive/Obstetrics                           Anesthesia Physical Anesthesia Plan  ASA: II  Anesthesia Plan: General   Post-op Pain Management:    Induction: Intravenous  Airway Management Planned: Oral ETT  Additional Equipment:   Intra-op Plan:   Post-operative Plan:   Informed Consent: I have reviewed the patients History and Physical, chart, labs and discussed the procedure including the risks, benefits and alternatives for the proposed anesthesia with the patient or authorized representative who has indicated his/her understanding and acceptance.   Dental Advisory Given and Dental advisory given  Plan Discussed with: CRNA and Surgeon  Anesthesia Plan Comments: (  Discussed  general anesthesia, including possible nausea, instrumentation of airway, sore throat,pulmonary aspiration, etc. I asked if the were any outstanding questions, or  concerns before we proceeded. )        Anesthesia Quick Evaluation

## 2012-05-03 NOTE — Progress Notes (Signed)
There has been no change in the patients history, status or exam since the history and physical.  Filed Vitals:   05/03/12 1028  BP: 121/72  Pulse: 74  Temp: 97.9 F (36.6 C)  TempSrc: Oral  Resp: 18  SpO2: 100%    Lab Results  Component Value Date   WBC 8.0 04/27/2012   HGB 13.1 04/27/2012   HCT 40.4 04/27/2012   MCV 91.6 04/27/2012   PLT 189 04/27/2012    Brittley Regner A

## 2012-05-03 NOTE — Anesthesia Postprocedure Evaluation (Signed)
Anesthesia Post Note  Patient: Valerie [name]  Procedure(s) Performed: Procedure(s) (LRB): ROBOTIC ASSISTED TOTAL HYSTERECTOMY (N/A) SALPINGO OOPHERECTOMY (Bilateral) CYSTOSCOPY (N/A)  Anesthesia type: General  Patient location: PACU  Post pain: Pain level controlled  Post assessment: Post-op Vital signs reviewed  Last Vitals:  Filed Vitals:   05/03/12 1500  BP:   Pulse:   Temp: 36.9 C  Resp:     Post vital signs: Reviewed  Level of consciousness: sedated  Complications: No apparent anesthesia complications

## 2012-05-03 NOTE — Op Note (Signed)
05/03/2012  2:06 PM  PATIENT:  Valerie [name]  36 y.o. female  PRE-OPERATIVE DIAGNOSIS:  Menometrorrhagia  POST-OPERATIVE DIAGNOSIS:  Menometrorrhagia  PROCEDURE:  Procedure(s) (LRB): ROBOTIC ASSISTED TOTAL HYSTERECTOMY (N/A) R SALPINGO OOPHERECTOMY, L SALPINGECTOMY CYSTOSCOPY (N/A)  SURGEON:  Surgeon(s) and Role:    * Valerie [name] A Valerie Mullenbach, MD - Primary    * Valerie Scott Bowie, MD - Assisting   ANESTHESIA:   general  EBL:  Total I/O In: 1000 [I.V.:1000] Out: 150 [Urine:150]  SPECIMEN:  Source of Specimen:  uterus, cervix, R ovary, bilateral tubes  DISPOSITION OF SPECIMEN:  PATHOLOGY  COUNTS:  YES  PLAN OF CARE: Admit for overnight observation  PATIENT DISPOSITION:  PACU - hemodynamically stable.   Delay start of Pharmacological VTE agent (>24hrs) due to surgical blood loss or risk of bleeding: not applicable  Complications:  None.  Findings:  10 weeks size boggy uterus, weighing 305 gm.  Ovaries were normal bilaterally, though left was much smaller than right.  There was endometriosis on L tube.  The cul de sac appeared normal. The ureters were identified during multiple points of the case and were always out of the field of dissection.  On cystoscopy, the bladder was intact and bilateral spill was seen from each ureteral orriface.  There were adhesions of omentum to upper anterior abdominal wall which were left in place.  The appendix and liver edge were normal.  Medications:  Ancef.  Indigo carmine.  Surgiflo.    Technique:  After adequate anesthesia was achieved the patient was positioned, prepped and draped in usual sterile fashion.  A speculum was placed in the vagina and the cervix dilated with pratt dilators.  The 8 cm Rumi and 3 cm Koh ring were assembled and placed in proper fashion.  The  Speculum was removed and the bladder catheterized with a foley.    Attention was turned to the abdomen where a 1 cm incision was made above the umbilicus.  The veress needle was  inserted without aspiration of bowel contents or blood.  The long 12 mm trocar was placed and the other three trocar sites were marked out, all approximately 10 cm from each other and the camera.  Two 8.5 mm trocars were placed on either side of the camera port.  A 5 mm assistant port was placed 3 cm above the left iliac crest.  All trocars were inserted under direct visualization of the camera.  The patient was placed in trendelenburg and then the Robot docked.  The PK forceps were placed on arm 2 and the Hot shears on arm 1 and introduced under direct visualization of the camera    I then broke scrub and sat down at the console.  The above findings were noted and the ureters identified well out of the field of dissection.  The round ligament on the right was cauterized with the PK and divided with the shears.  The right IP ligament was then divided with the PK cautery and shears.  The posterior broad ligament was then divided with the hot shears until the uterosacral ligament.  The Broad and cardinal ligaments were then cauterized against the cervix to the level of the Koh ring, securing the uterine artery.  Each pedicle was then incised with the shears.  The anterior leaf was then incised at the reflection of the vessico-uterine junction and the lateral bladder retracted inferiorly after the round ligament on the left had been divided with the PK forceps.  The left tube   was cauterized with the PK and divided with the shears;  then the left utero-ovarian ligament divided with the PK forceps and the scissors.  The left broad ligament and cardinal ligaments were incised in the same way as the right.  The vessels were very big and required multiple applications of cautery.  The ureters were confirmed well away from the field of dissection at all times.   At the level of the internal os, the uterine arteries were bilaterally cauterized with the PK.  The ureters were again identified well out of the field of  dissection.  .   The bladder was then able to be retracted inferiorly and the vesico-uterine fascia was incised in the midline until the bladder was removed one cm below the Koh ring.  The hot shears then circumferentially incised the vagina at the level of the reflection on the Koh ring.  Once the uterus and cervix were amputated, cautery was used to insure hemostasis of the cuff.  Once hemostasis was achieved, the instruments were changed to the long forceps and mega suture cut needle driver and the cuff was closed with a running stitches of 0-vicryl V loc.  Cautery was used to ensure hemostasis of the left pedicles very superficially after a 2-0 vicryl suture was attempted to help hemostasis.  The ureters were peristalsing bilaterally well and very lateral to the areas of operation.    The Robot was then undocked and I scrubbed back in.   There was some oozing still on the bladder flap, and because she had a slightly higher than normal bleeding rate from her skin incisions, I elected to add Surgiflo to the area of the bladder flap and vaginal cuff to ensure hemostasis.  All instruments were removed from the abdomen and the abdomen desufflated. The skin incisions were closed with subcuticular stitches of 3-0 vicryl Rapide and Dermabond.  All instruments were removed from the vagina and cystoscopy performed, revealing an intact bladder and vigourous spill of indigo carmine from each ureteral orifice.  The cystoscope was removed and the patient taken to the recovery room in stable condition.  Valerie [name] A  

## 2012-05-03 NOTE — Transfer of Care (Signed)
Immediate Anesthesia Transfer of Care Note  Patient: Valerie [name]  Procedure(s) Performed: Procedure(s) (LRB): ROBOTIC ASSISTED TOTAL HYSTERECTOMY (N/A) SALPINGO OOPHERECTOMY (Bilateral) CYSTOSCOPY (N/A)  Patient Location: PACU  Anesthesia Type: General  Level of Consciousness: awake and sedated  Airway & Oxygen Therapy: Patient Spontanous Breathing and Patient connected to nasal cannula oxygen  Post-op Assessment: Report given to PACU RN and Post -op Vital signs reviewed and stable  Post vital signs: Reviewed and stable  Complications: No apparent anesthesia complications

## 2012-05-04 ENCOUNTER — Encounter (HOSPITAL_COMMUNITY): Payer: Self-pay | Admitting: Obstetrics and Gynecology

## 2012-05-04 LAB — CBC
HCT: 31 % — ABNORMAL LOW (ref 36.0–46.0)
MCH: 30.3 pg (ref 26.0–34.0)
MCV: 91.2 fL (ref 78.0–100.0)
Platelets: 208 10*3/uL (ref 150–400)
RBC: 3.4 MIL/uL — ABNORMAL LOW (ref 3.87–5.11)

## 2012-05-04 MED ORDER — OXYCODONE-ACETAMINOPHEN 5-325 MG PO TABS: 1.0000 | ORAL_TABLET | ORAL | Status: DC | PRN

## 2012-05-04 MED ORDER — OXYCODONE HCL 10 MG PO TB12: 10.0000 mg | ORAL_TABLET | Freq: Two times a day (BID) | ORAL | Status: DC

## 2012-05-04 NOTE — Anesthesia Postprocedure Evaluation (Signed)
  Anesthesia Post-op Note  Patient: Valerie [name]  Procedure(s) Performed: Procedure(s) (LRB): ROBOTIC ASSISTED TOTAL HYSTERECTOMY (N/A) SALPINGO OOPHERECTOMY (Bilateral) CYSTOSCOPY (N/A)  Patient Location: [facility]  Anesthesia Type: General  Level of Consciousness: awake, alert  and oriented  Airway and Oxygen Therapy: Patient Spontanous Breathing  Post-op Pain: none  Post-op Assessment: Patient's Cardiovascular Status Stable and Respiratory Function Stable  Post-op Vital Signs: Reviewed and stable  Complications: No apparent anesthesia complications

## 2012-05-04 NOTE — Progress Notes (Signed)
Patient is eating, ambulating, AND voiding.  Pain control is good.  BP 112/50  Pulse 63  Temp 98.2 F (36.8 C) (Oral)  Resp 18  SpO2 100%  lungs:   clear to auscultation cor:    RRR Abdomen:  soft, appropriate tenderness, incisions intact and without erythema or exudate. ex:    no cords   Lab Results  Component Value Date   WBC 17.7* 05/04/2012   HGB 10.3* 05/04/2012   HCT 31.0* 05/04/2012   MCV 91.2 05/04/2012   PLT 208 05/04/2012    A/P  Routine care.  Expect d/c per plan.

## 2012-05-04 NOTE — Discharge Summary (Signed)
Physician Discharge Summary  Patient ID: Valerie [name] MRN: [id number] DOB/AGE: September 16, 1975 36 y.o.  Admit date: 05/03/2012 Discharge date: 05/04/2012  Admission Diagnoses:menorrhagia, RLQ pain, endometriosis.  Discharge Diagnoses: same. Active Problems:  * No active hospital problems. *    Discharged Condition: good  Hospital Course: Uncomplicated Robo TLH, RSO, L salpingectomy, cysto.  D/ced on postoperative day one.  Consults: None  Significant Diagnostic Studies: labs:  Lab Results  Component Value Date   WBC 17.7* 05/04/2012   HGB 10.3* 05/04/2012   HCT 31.0* 05/04/2012   MCV 91.2 05/04/2012   PLT 208 05/04/2012     Treatments: surgery: Robo TLH, RSO, L salpingectomy, cysto  Discharge Exam: Blood pressure 112/50, pulse 63, temperature 98.2 F (36.8 C), temperature source Oral, resp. rate 18, SpO2 100.00%.   Disposition:   Discharge Orders    Future Orders Please Complete By Expires   Diet - low sodium heart healthy      Discharge instructions      Comments:   No driving on narcotics, no sexual activity for 2 weeks.   Increase activity slowly      May shower / Bathe      Comments:   Shower, no bath for 2 weeks.   Sexual Activity Restrictions      Comments:   No sexual activity for 2 weeks.   Remove dressing in 24 hours      Call MD for:  temperature >100.4        Medication List  As of 05/04/2012  8:13 AM   TAKE these medications         fexofenadine-pseudoephedrine 180-240 MG per 24 hr tablet   Commonly known as: ALLEGRA-D 24   Take 1 tablet by mouth daily.      levothyroxine 88 MCG tablet   Commonly known as: SYNTHROID, LEVOTHROID   Take 88 mcg by mouth daily.      MULTIVITAMIN/IRON PO   Take 1 tablet by mouth daily. Flinstones with Iron      oxyCODONE-acetaminophen 5-325 MG per tablet   Commonly known as: PERCOCET/ROXICET   Take 1-2 tablets by mouth every 4 (four) hours as needed (moderate to severe pain (when tolerating fluids)).            Follow-up Information    Follow up with Jayvion Stefanski A, MD. Schedule an appointment as soon as possible for a visit in 2 weeks.   Contact information:   [address] [place] [address] [phone]          Signed: Loney Laurence 05/04/2012, 8:13 AM

## 2012-05-04 NOTE — Addendum Note (Signed)
Addendum  created 05/04/12 0854 by Suella Grove, CRNA   Modules edited:Notes Section

## 2014-02-12 ENCOUNTER — Encounter: Payer: Self-pay | Admitting: Emergency Medicine

## 2014-02-12 ENCOUNTER — Emergency Department
Admission: EM | Admit: 2014-02-12 | Discharge: 2014-02-12 | Disposition: A | Discharge status: Home / Self Care | Payer: Managed Care, Other (non HMO) | Source: Home / Self Care | Attending: Family Medicine | Admitting: Family Medicine

## 2014-02-12 DIAGNOSIS — J45901 Unspecified asthma with (acute) exacerbation: Secondary | ICD-10-CM

## 2014-02-12 DIAGNOSIS — J309 Allergic rhinitis, unspecified: Secondary | ICD-10-CM

## 2014-02-12 DIAGNOSIS — J069 Acute upper respiratory infection, unspecified: Secondary | ICD-10-CM

## 2014-02-12 DIAGNOSIS — J3089 Other allergic rhinitis: Secondary | ICD-10-CM

## 2014-02-12 DIAGNOSIS — B9789 Other viral agents as the cause of diseases classified elsewhere: Principal | ICD-10-CM

## 2014-02-12 HISTORY — DX: Other seasonal allergic rhinitis: J30.2

## 2014-02-12 HISTORY — DX: Disorder of thyroid, unspecified: E07.9

## 2014-02-12 LAB — POCT RAPID STREP A (OFFICE): RAPID STREP A SCREEN: NEGATIVE

## 2014-02-12 MED ORDER — BENZONATATE 200 MG PO CAPS: 200.0000 mg | ORAL_CAPSULE | Freq: Every day | ORAL | Status: DC

## 2014-02-12 MED ORDER — AMOXICILLIN 875 MG PO TABS: 875.0000 mg | ORAL_TABLET | Freq: Two times a day (BID) | ORAL | Status: DC

## 2014-02-12 MED ORDER — PREDNISONE 20 MG PO TABS: 20.0000 mg | ORAL_TABLET | Freq: Two times a day (BID) | ORAL | Status: DC

## 2014-02-12 MED ORDER — ALBUTEROL SULFATE HFA 108 (90 BASE) MCG/ACT IN AERS: 2.0000 | INHALATION_SPRAY | Freq: Four times a day (QID) | RESPIRATORY_TRACT | Status: DC | PRN

## 2014-02-12 NOTE — Discharge Instructions (Signed)
Take plain Mucinex (1200 mg guaifenesin) twice daily for cough and congestion.  May add Sudafed for sinus congestion.   Increase fluid intake, rest. May use Afrin nasal spray (or generic oxymetazoline) twice daily for about 5 days.  Also recommend using saline nasal spray several times daily and saline nasal irrigation (AYR is a common brand) Try warm salt water gargles for sore throat.  Stop all antihistamines for now, and other non-prescription cough/cold preparations (Daquil, Nyquil, etc.) Add albuterol inhaler if needed. Begin Amoxicillin if not improving about one week or if persistent fever develops   Follow-up with family doctor if not improving about10 days.

## 2014-02-12 NOTE — ED Notes (Signed)
Pt c/o cough, sore throat, and hoarseness x 1 wk. Denies fever. She has taken dayquil and nyquil.

## 2014-02-12 NOTE — ED Provider Notes (Signed)
CSN: [id number]     Arrival date & time 02/12/14  1734 History   First MD Initiated Contact with Patient 02/12/14 1803     Chief Complaint  Patient presents with  . Cough  . Nasal Congestion      HPI Comments: Patient has a history of perennial rhinitis and mild asthma.  She takes Allegra D daily, but rarely needs to use her albuterol inhaler. One week ago she suddenly developed sore throat, fatigue, myalgias, and increased sinus congestion.  Three days ago she developed a productive cough, worse at night, with occasional wheezing.  She denies fevers, chills, and sweats.  The history is provided by the patient.    Past Medical History  Diagnosis Date  . PONV (postoperative nausea and vomiting)   . Liver disorder     fatty liver disease  . Anemia     Hx blood transfusion 3-5847yrs ago and iron infusion last month 03/2012  . Seasonal allergies   . Thyroid disease    Past Surgical History  Procedure Laterality Date  . Thyroid surgery      removed  . Diagnostic laparoscopy    . Salpingoophorectomy  05/03/2012    Procedure: SALPINGO OOPHERECTOMY;  Surgeon: Loney LaurenceMichelle A Horvath, MD;  Location: [facility];  Service: Gynecology;  Laterality: Bilateral;  . Cystoscopy  05/03/2012    Procedure: CYSTOSCOPY;  Surgeon: Loney LaurenceMichelle A Horvath, MD;  Location: [facility];  Service: Gynecology;  Laterality: N/A;  . Abdominal hysterectomy     Family History  Problem Relation Age of Onset  . Hypertension Mother    History  Substance Use Topics  . Smoking status: Never Smoker   . Smokeless tobacco: Not on file  . Alcohol Use: No     Comment: rarely   OB History   Grav Para Term Preterm Abortions TAB SAB Ect Mult Living                 Review of Systems + sore throat + hoarseness + cough No pleuritic pain + wheezing + nasal congestion + post-nasal drainage No sinus pain/pressure No itchy/red eyes No earache No hemoptysis + SOB No fever/chills No nausea + vomiting, resolved No abdominal  pain No diarrhea No urinary symptoms No skin rash + fatigue + myalgias + headache Used OTC meds without relief  Allergies  Vicodin  Home Medications   Prior to Admission medications   Medication Sig Start Date End Date Taking? Authorizing Provider  albuterol (PROVENTIL HFA;VENTOLIN HFA) 108 (90 BASE) MCG/ACT inhaler Inhale 2 puffs into the lungs every 6 (six) hours as needed for wheezing or shortness of breath. 02/12/14   Lattie HawStephen A Beese, MD  amoxicillin (AMOXIL) 875 MG tablet Take 1 tablet (875 mg total) by mouth 2 (two) times daily. (Rx void after 02/20/14) 02/12/14   Lattie HawStephen A Beese, MD  benzonatate (TESSALON) 200 MG capsule Take 1 capsule (200 mg total) by mouth at bedtime. Take as needed for cough 02/12/14   Lattie HawStephen A Beese, MD  fexofenadine-pseudoephedrine (ALLEGRA-D 24) 180-240 MG per 24 hr tablet Take 1 tablet by mouth daily.    Historical Provider, MD  levothyroxine (SYNTHROID, LEVOTHROID) 88 MCG tablet Take 88 mcg by mouth daily.    Historical Provider, MD  Multiple Vitamins-Iron (MULTIVITAMIN/IRON PO) Take 1 tablet by mouth daily. Flinstones with Iron    Historical Provider, MD  predniSONE (DELTASONE) 20 MG tablet Take 1 tablet (20 mg total) by mouth 2 (two) times daily. Take with food. 02/12/14   Jeannett SeniorStephen  A Beese, MD   BP 123/85  Pulse 100  Temp(Src) 98.5 F (36.9 C) (Oral)  Resp 18  Ht 5\' 2"  (1.575 m)  Wt 162 lb (73.483 kg)  BMI 29.62 kg/m2  SpO2 99%  LMP 04/05/2012 Physical Exam Nursing notes and Vital Signs reviewed. Appearance:  Patient appears healthy, stated age, and in no acute distress Eyes:  Pupils are equal, round, and reactive to light and accomodation.  Extraocular movement is intact.  Conjunctivae are not inflamed  Ears:  Canals normal.  Tympanic membranes normal.  Nose:  Mildly congested turbinates.  No sinus tenderness.   Pharynx:   Minimal erythema Neck:  Supple.  Tender enlarged posterior nodes are palpated bilaterally  Lungs:  Clear to auscultation.   Breath sounds are equal.  Chest:  Distinct tenderness to palpation over the mid-sternum.  Heart:  Regular rate and rhythm without murmurs, rubs, or gallops.  Abdomen:  Nontender without masses or hepatosplenomegaly.  Bowel sounds are present.  No CVA or flank tenderness.  Extremities:  No edema.  No calf tenderness Skin:  No rash present.   ED Course  Procedures  none    Labs Reviewed  POCT RAPID STREP A (OFFICE) negative         MDM   1. Viral URI with cough   2. Perennial allergic rhinitis   3. Asthma with acute exacerbation    Begin prednisone burst.  Prescription written for Benzonatate (Tessalon) to take at bedtime for night-time cough.  Refill albuterol Take plain Mucinex (1200 mg guaifenesin) twice daily for cough and congestion.  May add Sudafed for sinus congestion.   Increase fluid intake, rest. May use Afrin nasal spray (or generic oxymetazoline) twice daily for about 5 days.  Also recommend using saline nasal spray several times daily and saline nasal irrigation (AYR is a common brand) Try warm salt water gargles for sore throat.  Stop all antihistamines for now, and other non-prescription cough/cold preparations (Daquil, Nyquil, etc.) Add albuterol inhaler if needed. Begin Amoxicillin if not improving about one week or if persistent fever develops   Follow-up with family doctor if not improving about10 days    Lattie Haw, MD 02/12/14 2041

## 2014-05-08 ENCOUNTER — Ambulatory Visit (INDEPENDENT_AMBULATORY_CARE_PROVIDER_SITE_OTHER): Payer: Managed Care, Other (non HMO) | Admitting: Obstetrics and Gynecology

## 2014-05-08 ENCOUNTER — Encounter: Payer: Self-pay | Admitting: Obstetrics and Gynecology

## 2014-05-08 VITALS — BP 110/64 | HR 80 | Resp 14 | Ht 64.5 in | Wt 162.8 lb

## 2014-05-08 DIAGNOSIS — Z01419 Encounter for gynecological examination (general) (routine) without abnormal findings: Secondary | ICD-10-CM

## 2014-05-08 DIAGNOSIS — Z0000 Encounter for general adult medical examination without abnormal findings: Secondary | ICD-10-CM

## 2014-05-08 LAB — POCT URINALYSIS DIPSTICK
BILIRUBIN UA: NEGATIVE
Clarity, UA: NEGATIVE
GLUCOSE UA: NEGATIVE
Ketones, UA: NEGATIVE
Leukocytes, UA: NEGATIVE
NITRITE UA: NEGATIVE
PH UA: 5
Protein, UA: NEGATIVE
RBC UA: NEGATIVE
UROBILINOGEN UA: NEGATIVE

## 2014-05-08 NOTE — Progress Notes (Signed)
Patient ID: Valerie [name], female   DOB: Mar 19, 1976, 38 y.o.   MRN: [id number] GYNECOLOGY VISIT  PCP:   Leretha Pol, MD  Referring provider:   HPI: 38 y.o.   Married  Caucasian  female   No obstetric history on file. with Patient's last menstrual period was 04/05/2012.   here for   AEX. Status post robotic TLH, RSO, left salpingectomy.   Status post thyroidectomy.  Difficulty getting thyroid regulated.  Sees Dr. Horald Pollen for thyroid control.   Not as many headaches now.  Frustrated with weight gain.  Not exercising.  Hgb:    PCP Urine:  Neg  GYNECOLOGIC HISTORY: Patient's last menstrual period was 04/05/2012. Sexually active:  yes Partner preference: female Contraception:   hysterectomy Menopausal hormone therapy: none DES exposure: no   Blood transfusions:   Yes, 3-4 years ago Sexually transmitted diseases:  Hx of Chlamydia at age 37  GYN procedures and prior surgeries:  R-TLH/RSO/Left salpingectomy. Last mammogram:  n/a               Last pap and high risk HPV testing:  14-15 months ago:wnl  History of abnormal pap smear:  no   OB History   Grav Para Term Preterm Abortions TAB SAB Ect Mult Living   LIFESTYLE: Exercise:   no            OTHER HEALTH MAINTENANCE: Tetanus/TDap:  2012 HPV:                 n/a Influenza:          2013   Bone density:    n/a Colonoscopy:   n/a  Cholesterol check:  1 year ago normal  Family History  Problem Relation Age of Onset  . Hypertension Mother   . Kawasaki disease Brother     Dec 9 mo.  . Breast cancer Paternal Grandmother   . COPD Paternal Grandmother     dec  . Emphysema Paternal Grandmother   . Thyroid disease Sister   . Hypertension Maternal Grandmother   . Diabetes Maternal Grandmother   . Diabetes Paternal Grandfather     Patient Active Problem List   Diagnosis Date Noted  . ANEMIA-IRON DEFICIENCY 09/04/2008   Past Medical History  Diagnosis Date  . PONV (postoperative nausea and  vomiting)   . Liver disorder     fatty liver disease  . Seasonal allergies   . Thyroid disease   . Endometriosis   . Anemia     Hx blood transfusion 3-43yrs ago and iron infusion last month 03/2012  . Blood transfusion without reported diagnosis 2013  . Anxiety   . STD (sexually transmitted disease)     Tx'd for Chlamydia at age 47    Past Surgical History  Procedure Laterality Date  . Thyroid surgery      removed  . Diagnostic laparoscopy    . Cystoscopy  05/03/2012    Procedure: CYSTOSCOPY;  Surgeon: Loney Laurence, MD;  Location: [facility];  Service: Gynecology;  Laterality: N/A;  . Abdominal hysterectomy    . Salpingoophorectomy  05/03/2012    Procedure: SALPINGO OOPHERECTOMY;  Surgeon: Loney Laurence, MD;  Location: [facility];  Service: Gynecology;  Laterality: Bilateral;    ALLERGIES: Vicodin  Current Outpatient Prescriptions  Medication Sig Dispense Refill  . albuterol (PROVENTIL HFA;VENTOLIN HFA) 108 (90 BASE) MCG/ACT inhaler Inhale 2 puffs into the lungs  every 6 (six) hours as needed for wheezing or shortness of breath.  1 Inhaler  0  . fexofenadine-pseudoephedrine (ALLEGRA-D 24) 180-240 MG per 24 hr tablet Take 1 tablet by mouth daily.      Marland Kitchen levothyroxine (SYNTHROID, LEVOTHROID) 50 MCG tablet Take 50 mcg by mouth. Takes 1 tab. Qod alternating with tablet.      Marland Kitchen levothyroxine (SYNTHROID, LEVOTHROID) 88 MCG tablet Take 88 mcg by mouth daily. Takes 1 tab. Qod alternating with tablet.       No current facility-administered medications for this visit.     ROS:  Pertinent items are noted in HPI.  History   Social History  . Marital Status: Married    Spouse Name: N/A    Number of Children: N/A  . Years of Education: N/A   Occupational History  . Not on file.   Social History Main Topics  . Smoking status: Never Smoker   . Smokeless tobacco: Not on file  . Alcohol Use: 1.0 oz/week    2 drink(s) per week     Comment: rarely  . Drug Use: No  .  Sexual Activity: Yes    Partners: Male    Birth Control/ Protection: Surgical     Comment: hyst-left ovary remains   Other Topics Concern  . Not on file   Social History Narrative  . No narrative on file    PHYSICAL EXAMINATION:    BP 110/64  Pulse 80  Resp 14  Ht 5' 4.5" (1.638 m)  Wt 162 lb 12.8 oz (73.846 kg)  BMI 27.52 kg/m2  LMP 04/05/2012   Wt Readings from Last 3 Encounters:  05/08/14 162 lb 12.8 oz (73.846 kg)  02/12/14 162 lb (73.483 kg)  04/27/12 147 lb (66.679 kg)     Ht Readings from Last 3 Encounters:  05/08/14 5' 4.5" (1.638 m)  02/12/14  (1.575 m)  04/27/12  (1.575 m)    General appearance: alert, cooperative and appears stated age Head: Normocephalic, without obvious abnormality, atraumatic Neck: no adenopathy, supple, symmetrical, trachea midline and thyroid not enlarged, symmetric, no tenderness/mass/nodules Lungs: clear to auscultation bilaterally Breasts: Inspection negative, No nipple retraction or dimpling, No nipple discharge or bleeding, No axillary or supraclavicular adenopathy, Normal to palpation without dominant masses Heart: regular rate and rhythm Abdomen: well healed incisions, soft, non-tender; no masses,  no organomegaly Extremities: extremities normal, atraumatic, no cyanosis or edema Skin: Skin color, texture, turgor normal. No rashes or lesions Lymph nodes: Cervical, supraclavicular, and axillary nodes normal. No abnormal inguinal nodes palpated Neurologic: Grossly normal  Pelvic: External genitalia:  no lesions              Urethra:  normal appearing urethra with no masses, tenderness or lesions              Bartholins and Skenes: normal                 Vagina: normal appearing vagina with normal color and discharge, no lesions              Cervix: absent              Pap and high risk HPV testing done: No.        Bimanual Exam:  Uterus:   absent  Adnexa:   nontender and no masses                                       Rectovaginal:  No.                                  ASSESSMENT  Normal gynecologic exam. Status post robotic TLH, RSO, and left salpingectomy.  PLAN  Mammogram recommended yearly starting at age 51.  Patient will schedule at [facility] next year. Pap smear and high risk HPV testing as above. Counseled on self breast exam, Calcium and vitamin D intake, exercise, and gradual weight loss.  See lab orders: No. Return annually or prn   An After Visit Summary was printed and given to the patient.

## 2014-05-08 NOTE — Patient Instructions (Signed)

## 2014-07-07 ENCOUNTER — Encounter: Payer: Self-pay | Admitting: Obstetrics and Gynecology

## 2014-09-01 ENCOUNTER — Other Ambulatory Visit: Payer: Self-pay

## 2014-09-01 DIAGNOSIS — Z1231 Encounter for screening mammogram for malignant neoplasm of breast: Secondary | ICD-10-CM

## 2014-09-19 ENCOUNTER — Ambulatory Visit
Admission: RE | Admit: 2014-09-19 | Discharge: 2014-09-19 | Disposition: A | Discharge status: Home / Self Care | Payer: Managed Care, Other (non HMO) | Source: Ambulatory Visit

## 2014-09-19 DIAGNOSIS — Z1231 Encounter for screening mammogram for malignant neoplasm of breast: Secondary | ICD-10-CM

## 2014-09-23 ENCOUNTER — Other Ambulatory Visit: Payer: Self-pay | Admitting: Obstetrics and Gynecology

## 2014-09-23 DIAGNOSIS — R928 Other abnormal and inconclusive findings on diagnostic imaging of breast: Secondary | ICD-10-CM

## 2014-09-24 ENCOUNTER — Ambulatory Visit
Admission: RE | Admit: 2014-09-24 | Discharge: 2014-09-24 | Disposition: A | Discharge status: Home / Self Care | Payer: Managed Care, Other (non HMO) | Source: Ambulatory Visit | Attending: Obstetrics and Gynecology | Admitting: Obstetrics and Gynecology

## 2014-09-24 DIAGNOSIS — R928 Other abnormal and inconclusive findings on diagnostic imaging of breast: Secondary | ICD-10-CM

## 2015-06-05 ENCOUNTER — Encounter: Payer: Self-pay | Admitting: Obstetrics and Gynecology

## 2015-06-05 ENCOUNTER — Ambulatory Visit (INDEPENDENT_AMBULATORY_CARE_PROVIDER_SITE_OTHER): Payer: Managed Care, Other (non HMO) | Admitting: Obstetrics and Gynecology

## 2015-06-05 VITALS — BP 122/86 | HR 68 | Resp 16 | Ht 62.5 in | Wt 165.0 lb

## 2015-06-05 DIAGNOSIS — R635 Abnormal weight gain: Secondary | ICD-10-CM | POA: Diagnosis not present

## 2015-06-05 DIAGNOSIS — R5383 Other fatigue: Secondary | ICD-10-CM | POA: Diagnosis not present

## 2015-06-05 DIAGNOSIS — R5381 Other malaise: Secondary | ICD-10-CM

## 2015-06-05 DIAGNOSIS — E038 Other specified hypothyroidism: Secondary | ICD-10-CM | POA: Diagnosis not present

## 2015-06-05 DIAGNOSIS — Z01419 Encounter for gynecological examination (general) (routine) without abnormal findings: Secondary | ICD-10-CM

## 2015-06-05 NOTE — Patient Instructions (Signed)
Stop Prometrium.  Take Estrace 1 mg by mouth daily.  You can take two of your estrogen tablets that you currently have.   EXERCISE AND DIET:  We recommended that you start or continue a regular exercise program for good health. Regular exercise means any activity that makes your heart beat faster and makes you sweat.  We recommend exercising at least 30 minutes per day at least 3 days a week, preferably 4 or 5.  We also recommend a diet low in fat and sugar.  Inactivity, poor dietary choices and obesity can cause diabetes, heart attack, stroke, and kidney damage, among others.    ALCOHOL AND SMOKING:  Women should limit their alcohol intake to no more than 7 drinks/beers/glasses of wine (combined, not each!) per week. Moderation of alcohol intake to this level decreases your risk of breast cancer and liver damage. And of course, no recreational drugs are part of a healthy lifestyle.  And absolutely no smoking or even second hand smoke. Most people know smoking can cause heart and lung diseases, but did you know it also contributes to weakening of your bones? Aging of your skin?  Yellowing of your teeth and nails?  CALCIUM AND VITAMIN D:  Adequate intake of calcium and Vitamin D are recommended.  The recommendations for exact amounts of these supplements seem to change often, but generally speaking 600 mg of calcium (either carbonate or citrate) and 800 units of Vitamin D per day seems prudent. Certain women may benefit from higher intake of Vitamin D.  If you are among these women, your doctor will have told you during your visit.    PAP SMEARS:  Pap smears, to check for cervical cancer or precancers,  have traditionally been done yearly, although recent scientific advances have shown that most women can have pap smears less often.  However, every woman still should have a physical exam from her gynecologist every year. It will include a breast check, inspection of the vulva and vagina to check for abnormal  growths or skin changes, a visual exam of the cervix, and then an exam to evaluate the size and shape of the uterus and ovaries.  And after 40 years of age, a rectal exam is indicated to check for rectal cancers. We will also provide age appropriate advice regarding health maintenance, like when you should have certain vaccines, screening for sexually transmitted diseases, bone density testing, colonoscopy, mammograms, etc.   MAMMOGRAMS:  All women over 46 years old should have a yearly mammogram. Many facilities now offer a "3D" mammogram, which may cost around $50 extra out of pocket. If possible,  we recommend you accept the option to have the 3D mammogram performed.  It both reduces the number of women who will be called back for extra views which then turn out to be normal, and it is better than the routine mammogram at detecting truly abnormal areas.    COLONOSCOPY:  Colonoscopy to screen for colon cancer is recommended for all women at age 70.  We know, you hate the idea of the prep.  We agree, BUT, having colon cancer and not knowing it is worse!!  Colon cancer so often starts as a polyp that can be seen and removed at colonscopy, which can quite literally save your life!  And if your first colonoscopy is normal and you have no family history of colon cancer, most women don't have to have it again for 10 years.  Once every ten years, you can  do something that may end up saving your life, right?  We will be happy to help you get it scheduled when you are ready.  Be sure to check your insurance coverage so you understand how much it will cost.  It may be covered as a preventative service at no cost, but you should check your particular policy.

## 2015-06-05 NOTE — Progress Notes (Signed)
[name] here for annual exam.    Patient is up 30 pounds on her weight.  Doing to [employer].  On multiple supplements and started HRT.  Estradiol level was under 5.  Not seeing Dr. Horald [name] for endocrinology care.  Generally feels poorly and is frustrated with her weight and inability to feel better with the medical care she is receiving.   Feels tired.  Feels forgetful.  Cloudy headed. Hands swollen. No hot flashes or night sweats.  Decreased libido. On Estrace and Prometrium.   Recent dx of diverticulitis.  Had CT scan.  Treated with pain medication and abx.   PCP:   Valerie [name] [facility].  Patient's last menstrual period was 04/05/2012.          Sexually active: Yes.    The current method of family planning is status post hysterectomy.    Exercising: Yes.    Walking gym Smoker:  no  Health Maintenance: Pap:  01/12/11 Neg History of abnormal Pap:  Yes in her early 76s.  MMG:  09/19/14 BIRADS0: Incomplete. 09/24/14 [place] right BIRADS2:Benign  Colonoscopy:  Never BMD:   Never   TDaP:  2012 Screening Labs:  Hb today: PCP, Urine today: PCP   reports that she has never smoked. She has never used smokeless tobacco. She reports that she drinks about 1.0 oz of alcohol per week. She reports that she does not use illicit drugs.  Past Medical History  Diagnosis Date  . PONV (postoperative nausea and vomiting)   . Liver disorder     fatty liver disease  . Seasonal allergies   . Thyroid disease   . Endometriosis   . Anemia     Hx blood transfusion 3-66yrs ago and iron infusion last month 03/2012  . Blood transfusion without reported diagnosis 2013  . Anxiety   . STD (sexually transmitted disease)     Tx'd for Chlamydia at age 28    Past Surgical History  Procedure Laterality Date  . Thyroid surgery      removed  . Diagnostic laparoscopy    . Cystoscopy  05/03/2012    Procedure: CYSTOSCOPY;  Surgeon: Loney Laurence, MD;  Location: [facility];  Service: Gynecology;  Laterality: N/A;  . Abdominal hysterectomy    . Salpingoophorectomy  05/03/2012    Procedure: SALPINGO OOPHERECTOMY;  Surgeon: Loney Laurence, MD;  Location: [facility];  Service: Gynecology;  Laterality: Bilateral;    Current Outpatient Prescriptions  Medication Sig Dispense Refill  . estradiol (ESTRACE) 0.5 MG tablet Take 0.5 mg by mouth daily.  3  . fexofenadine-pseudoephedrine (ALLEGRA-D 24) 180-240 MG per 24 hr tablet Take 1 tablet by mouth daily.    . Menaquinone-7 (VITAMIN K2 PO) Take by mouth daily.    . Nutritional Supplements (LIVER DEFENSE PO) Take by mouth daily.    Marland Kitchen PRASTERONE, DHEA, PO Take 15 mg by mouth daily.    . Pregnenolone POWD by Does not apply route. Capsule. 1 daily    . progesterone (PROMETRIUM) 100 MG capsule Take 1 capsule by mouth daily.    Marland Kitchen thyroid (ARMOUR) 65 MG tablet Take 65 mg by mouth daily. Nature-Thyroid    . albuterol (PROVENTIL HFA;VENTOLIN HFA) 108 (90 BASE) MCG/ACT inhaler Inhale 2 puffs into the lungs every 6 (six) hours as needed for wheezing or shortness of breath. (Patient not taking: Reported on 06/05/2015) 1 Inhaler 0   No current facility-administered medications for this visit.  Family History  Problem Relation Age of Onset  . Hypertension Mother   . Kawasaki disease Brother     Dec 9 mo.  . Breast cancer Paternal Grandmother   . COPD Paternal Grandmother     dec  . Emphysema Paternal Grandmother   . Thyroid disease Sister   . Hypertension Maternal Grandmother   . Diabetes Maternal Grandmother   . Diabetes Paternal Grandfather     ROS:  Pertinent items are noted in HPI.  Otherwise, a comprehensive ROS was negative.  Exam:   BP 122/86 mmHg  Pulse 68  Resp 16  Ht 5' 2.5" (1.588 m)  Wt 165 lb (74.844 kg)  BMI 29.68 kg/m2  LMP 04/05/2012    General appearance: alert, cooperative and appears stated age Head: Normocephalic, without obvious abnormality, atraumatic Neck:  no adenopathy, supple, symmetrical, trachea midline and thyroid absent. Lungs: clear to auscultation bilaterally Breasts: normal appearance, no masses or tenderness, Inspection negative, No nipple retraction or dimpling, No nipple discharge or bleeding, No axillary or supraclavicular adenopathy Heart: regular rate and rhythm Abdomen: soft, non-tender; bowel sounds normal; no masses,  no organomegaly Extremities: extremities normal, atraumatic, no cyanosis or edema Skin: Skin color, texture, turgor normal. No rashes or lesions Lymph nodes: Cervical, supraclavicular, and axillary nodes normal. No abnormal inguinal nodes palpated Neurologic: Grossly normal  Pelvic: External genitalia:  no lesions              Urethra:  normal appearing urethra with no masses, tenderness or lesions              Bartholins and Skenes: normal                 Vagina: normal appearing vagina with normal color and discharge, no lesions              Cervix: absent                Bimanual Exam:  Uterus:  uterus absent              Adnexa: normal adnexa and no mass, fullness, tenderness              Rectovaginal: Yes.  .  Confirms.              Anus:  normal sphincter tone, no lesions  Chaperone was present for exam.  Assessment:   Well woman visit with normal exam. Status post robotic TLH, RSO, and left salpingectomy. Hypothyroidism.  On Armor thryoid. On Estrace and Prometrium.  Malaise and fatigue.   Plan: Yearly mammogram recommended after age 5.  Recommended self breast exam.  Pap and HR HPV as above. Discussed Calcium, Vitamin D, regular exercise program including cardiovascular and weight bearing exercise. Labs performed.   No. Refills given on medications.   No.   Will increase Estrace to 1 mg daily.  Stop Prometrium.  I am recommending patient see an endocrinologist at [facility].  I will make a referral for her.  I am also recommending a comprehensive weight management program through [facility].  Follow up annually and prn.   An additional 30 minutes spent discussing weight gain, malaise and fatigue, hypoestrogenic state, and hypothyroidism.    After visit summary provided.

## 2015-06-09 ENCOUNTER — Telehealth: Payer: Self-pay | Admitting: Emergency Medicine

## 2015-06-09 DIAGNOSIS — R5381 Other malaise: Secondary | ICD-10-CM

## 2015-06-09 DIAGNOSIS — R5383 Other fatigue: Secondary | ICD-10-CM

## 2015-06-09 DIAGNOSIS — R7303 Prediabetes: Secondary | ICD-10-CM

## 2015-06-09 DIAGNOSIS — E039 Hypothyroidism, unspecified: Secondary | ICD-10-CM

## 2015-06-09 NOTE — Telephone Encounter (Signed)
Scheduled at [facility], first available with MD, Dr. Elbert Ewings 08/27/15 at 1500. Arrive 15 minutes early.  [address] [place]  [phone] Fax  Message left to return call to [place] at [phone].

## 2015-06-09 NOTE — Telephone Encounter (Signed)
-----   Message from Patton Salles, MD sent at 06/05/2015  6:51 PM EDT ----- Regarding: Please make an appointment with endocrinology at [facility] Please make an appointment with endocrinology at [facility] for patient.  She has hypothyroidism and potential prediabetes.   She also has malaise and fatigue.   I would like for her to have first available appointment.   I was not able to do this through [facility].  Thank you.   [employer]

## 2015-06-23 NOTE — Telephone Encounter (Signed)
Calling patient to confirm she has received appointment information. Detailed message left okay per designated party release form to advise to please call back to confirm she has appointment information.  Message left to return call to [place] at [phone].

## 2015-06-24 NOTE — Telephone Encounter (Signed)
Patient says if you can leave a message on her voicemail regarding her appointment. She says [facility] has tried to call her but she is unable to answer it at work. Best contact # [phone]

## 2015-06-24 NOTE — Telephone Encounter (Signed)
Called patient. Left detailed message per her request. Appointment information given in detail. Advised if needs to cancel or reschedule to [facility]. Advised to please call back with any questions.  Routing to provider for final review.  Will close encounter.

## 2015-07-13 ENCOUNTER — Telehealth: Payer: Self-pay | Admitting: Obstetrics and Gynecology

## 2015-07-13 NOTE — Telephone Encounter (Signed)
Left message to call Bonnetta Allbee at [phone]. 

## 2015-07-13 NOTE — Telephone Encounter (Signed)
Patient is asking for a new estrogen prescription. Confirmed pharmacy with patient.

## 2015-07-16 ENCOUNTER — Other Ambulatory Visit: Payer: Self-pay | Admitting: Obstetrics and Gynecology

## 2015-07-16 MED ORDER — PROGESTERONE MICRONIZED 100 MG PO CAPS: 100.0000 mg | ORAL_CAPSULE | Freq: Every day | ORAL | Status: DC

## 2015-07-16 MED ORDER — ESTRADIOL 1 MG PO TABS: 1.0000 mg | ORAL_TABLET | Freq: Every day | ORAL | Status: DC

## 2015-07-16 NOTE — Telephone Encounter (Signed)
I just refilled the patient's estradiol 1 mg daily (one pill once daily) and Prometrium 100 mg daily for the next 11 months, until her annual exam is due.  I sent them through to her pharmacy.  She will need her mammogram done in January 2017.

## 2015-07-16 NOTE — Telephone Encounter (Signed)
Spoke with patient. Patient states that she was seeing another provider before she started seeing Dr.Silva. Previous provider started her on Estradiol 0.5 mg daily. Patient was seen for aex in office on 06/05/2015. States that Dr.Silva advised her to increase to Estradiol 0.5 mg twice per day. Patient is about to be out of current rx and is requesting refill for Estradiol 1 mg daily. Last MMG was 09/24/2014 Bi-Rads 2.  Routing to Dr.Silva for review.

## 2015-07-20 NOTE — Telephone Encounter (Signed)
Left message to call Kaitlyn at [phone]. 

## 2015-07-23 ENCOUNTER — Other Ambulatory Visit: Payer: Self-pay | Admitting: Obstetrics and Gynecology

## 2015-07-23 NOTE — Telephone Encounter (Signed)
Patient is correct.  I did tell her to stop the Prometrium.  Sorry for the confusion. It was still on her medication list.  I will correct this.

## 2015-07-23 NOTE — Telephone Encounter (Signed)
Left message to call Rollande Thursby at [phone]. 

## 2015-07-23 NOTE — Telephone Encounter (Signed)
Spoke with patient. Advised of message as seen below from Dr.Silva. Patient is agreeable. Patient states that she has not been taking Prometrium. Was advised by Dr.Silva at her aex to stop taking this medication. Advised I will clarify with Dr.Silva and return call.

## 2015-07-23 NOTE — Telephone Encounter (Signed)
Spoke with patient. Advised of message as seen below from Dr.Silva. Patient is agreeable and verbalizes understanding. Will only take the Estradiol 1 mg daily.  Routing to provider for final review. Patient agreeable to disposition. Will close encounter.

## 2015-09-18 ENCOUNTER — Other Ambulatory Visit: Payer: Self-pay

## 2015-09-18 DIAGNOSIS — Z1231 Encounter for screening mammogram for malignant neoplasm of breast: Secondary | ICD-10-CM

## 2015-10-16 ENCOUNTER — Ambulatory Visit
Admission: RE | Admit: 2015-10-16 | Discharge: 2015-10-16 | Disposition: A | Discharge status: Home / Self Care | Payer: Managed Care, Other (non HMO) | Source: Ambulatory Visit

## 2015-10-16 DIAGNOSIS — Z1231 Encounter for screening mammogram for malignant neoplasm of breast: Secondary | ICD-10-CM

## 2016-06-17 ENCOUNTER — Ambulatory Visit (INDEPENDENT_AMBULATORY_CARE_PROVIDER_SITE_OTHER): Payer: Managed Care, Other (non HMO) | Admitting: Obstetrics and Gynecology

## 2016-06-17 ENCOUNTER — Encounter: Payer: Self-pay | Admitting: Obstetrics and Gynecology

## 2016-06-17 VITALS — BP 120/74 | HR 72 | Ht 62.5 in | Wt 182.0 lb

## 2016-06-17 DIAGNOSIS — Z0000 Encounter for general adult medical examination without abnormal findings: Secondary | ICD-10-CM

## 2016-06-17 DIAGNOSIS — Z79899 Other long term (current) drug therapy: Secondary | ICD-10-CM

## 2016-06-17 DIAGNOSIS — Z01419 Encounter for gynecological examination (general) (routine) without abnormal findings: Secondary | ICD-10-CM

## 2016-06-17 LAB — POCT URINALYSIS DIPSTICK
Bilirubin, UA: NEGATIVE
Glucose, UA: NEGATIVE
KETONES UA: NEGATIVE
LEUKOCYTES UA: NEGATIVE
Nitrite, UA: NEGATIVE
PH UA: 5
PROTEIN UA: NEGATIVE
RBC UA: NEGATIVE
Urobilinogen, UA: NEGATIVE

## 2016-06-17 MED ORDER — ESTRADIOL 1 MG PO TABS: 1.0000 mg | ORAL_TABLET | Freq: Every day | ORAL | 11 refills | Status: DC

## 2016-06-17 NOTE — Progress Notes (Signed)
Patient ID: Valerie [name], female   DOB: 1976/07/27, 40 y.o.   MRN: [id number]  40 y.o. G58P3003 Married Caucasian female here for annual exam.    On Estrace 1 mg daily.  Struggles to remember this.   Husband quit drinking and she is feeling much less stressed now.  They have gained weight and are working on this together.   ROS: Having urinary incontinence with sneeze and cough.  Voiding more slowly. No UTIs. Abdominal bloating.  Weight gain.  Palpitations. Muscle/joint pain.  Headaches.  Improved on Zoloft by neurology.   PCP: Looking for new PCP.  Hopes to see Valerie [name] - PA in [place].  Valerie [name] -   Family friend.   Patient's last menstrual period was 04/05/2012 (approximate).           Sexually active: Yes.    The current method of family planning is status post hysterectomy.    Exercising: No.  The patient does not participate in regular exercise at present. Smoker:  no  Health Maintenance: Pap: 01/12/11 Neg History of abnormal Pap: Yes, in her early 12s.  MMG: 10/16/15, Density Category C, Bi-Rads 1: Negative Colonoscopy:  Never BMD:   Never   TDaP:  2012 Gardasil:   N/A HIV: 2002 with pregnancy Hep C: Not indicated due to age Screening Labs: Urine today: neg.   reports that she has never smoked. She has never used smokeless tobacco. She reports that she drinks about 1.0 oz of alcohol per week . She reports that she does not use drugs.  Past Medical History:  Diagnosis Date  . Anemia    Hx blood transfusion 3-93yrs ago and iron infusion last month 03/2012  . Anxiety   . Blood transfusion without reported diagnosis 2013  . Endometriosis   . Liver disorder    fatty liver disease  . PONV (postoperative nausea and vomiting)   . Seasonal allergies   . STD (sexually transmitted disease)    Tx'd for Chlamydia at age 21  . Thyroid disease     Past Surgical History:  Procedure Laterality Date  . ABDOMINAL HYSTERECTOMY    . CYSTOSCOPY   05/03/2012   Procedure: CYSTOSCOPY;  Surgeon: Loney Laurence, MD;  Location: [facility];  Service: Gynecology;  Laterality: N/A;  . DIAGNOSTIC LAPAROSCOPY    . SALPINGOOPHORECTOMY  05/03/2012   Procedure: SALPINGO OOPHERECTOMY;  Surgeon: Loney Laurence, MD;  Location: [facility];  Service: Gynecology;  Laterality: Bilateral;  . THYROID SURGERY     removed    Current Outpatient Prescriptions  Medication Sig Dispense Refill  . estradiol (ESTRACE) 1 MG tablet Take 1 tablet (1 mg total) by mouth daily. 30 tablet 10  . fexofenadine-pseudoephedrine (ALLEGRA-D 24) 180-240 MG per 24 hr tablet Take 1 tablet by mouth daily.    Marland Kitchen PRASTERONE, DHEA, PO Take 15 mg by mouth daily.    . sertraline (ZOLOFT) 25 MG tablet Take 25 mg by mouth daily. Take 3 tablets daily.    Marland Kitchen thyroid (ARMOUR) 65 MG tablet Take 65 mg by mouth daily. Nature-Thyroid     No current facility-administered medications for this visit.     Family History  Problem Relation Age of Onset  . Hypertension Mother   . Kawasaki disease Brother     Dec 9 mo.  . Thyroid disease Sister   . Breast cancer Paternal Grandmother   . COPD Paternal Grandmother     dec  . Emphysema Paternal Grandmother   .  Hypertension Maternal Grandmother   . Diabetes Maternal Grandmother   . Diabetes Paternal Grandfather     ROS:  Pertinent items are noted in HPI.  Otherwise, a comprehensive ROS was negative.  Exam:   BP 120/74 (BP Location: Right Arm, Patient Position: Sitting, Cuff Size: Normal)   Pulse 72   Ht 5' 2.5" (1.588 m)   Wt 182 lb (82.6 kg)   LMP 04/05/2012 (Approximate)   BMI 32.76 kg/m     General appearance: alert, cooperative and appears stated age Head: Normocephalic, without obvious abnormality, atraumatic Neck: no adenopathy, supple, symmetrical, trachea midline and thyroid normal to inspection and palpation Lungs: clear to auscultation bilaterally Breasts: normal appearance, no masses or tenderness, No nipple retraction or  dimpling, No nipple discharge or bleeding, No axillary or supraclavicular adenopathy Heart: regular rate and rhythm Abdomen: soft, non-tender; no masses, no organomegaly Extremities: extremities normal, atraumatic, no cyanosis or edema Skin: Skin color, texture, turgor normal. No rashes or lesions Lymph nodes: Cervical, supraclavicular, and axillary nodes normal. No abnormal inguinal nodes palpated Neurologic: Grossly normal  Pelvic: External genitalia:  no lesions              Urethra:  normal appearing urethra with no masses, tenderness or lesions              Bartholins and Skenes: normal                 Vagina: normal appearing vagina with normal color and discharge, no lesions              Cervix:  absent              Pap taken: No. Bimanual Exam:  Uterus:   absent              Adnexa: no mass, fullness, tenderness              Rectal exam: Yes.  .  Confirms.              Anus:  normal sphincter tone, no lesions  Chaperone was present for exam.  Assessment:   Well woman visit with normal exam. Status post robotic TLH, RSO, and left salpingectomy. Status post left ovarian cystectomy 07/08/2008. Hypothyroidism.  On Armor thryoid. On Estrace. Mild GSI.    Plan: Yearly mammogram recommended after age 40.  Recommended self breast exam.  Pap and HR HPV as above. Discussed Calcium, Vitamin D, regular exercise program including cardiovascular and weight bearing exercise. Continue Estrace 1 mg daily.  I discussed risks and benefits of ERT.  I did encourage weight loss which will help GSI.  ACOG handout on urinary incontinence.  Labs through other providers. Follow up annually and prn.       After visit summary provided.

## 2016-06-17 NOTE — Patient Instructions (Signed)

## 2016-11-25 ENCOUNTER — Other Ambulatory Visit: Payer: Self-pay | Admitting: Obstetrics and Gynecology

## 2016-11-25 DIAGNOSIS — Z1231 Encounter for screening mammogram for malignant neoplasm of breast: Secondary | ICD-10-CM

## 2016-12-16 ENCOUNTER — Ambulatory Visit
Admission: RE | Admit: 2016-12-16 | Discharge: 2016-12-16 | Disposition: A | Discharge status: Home / Self Care | Payer: Managed Care, Other (non HMO) | Source: Ambulatory Visit | Attending: Obstetrics and Gynecology | Admitting: Obstetrics and Gynecology

## 2016-12-16 DIAGNOSIS — Z1231 Encounter for screening mammogram for malignant neoplasm of breast: Secondary | ICD-10-CM

## 2016-12-20 ENCOUNTER — Other Ambulatory Visit: Payer: Self-pay | Admitting: Obstetrics and Gynecology

## 2016-12-20 DIAGNOSIS — R928 Other abnormal and inconclusive findings on diagnostic imaging of breast: Secondary | ICD-10-CM

## 2016-12-23 ENCOUNTER — Telehealth: Payer: Self-pay | Admitting: *Deleted

## 2016-12-23 NOTE — Telephone Encounter (Signed)
Patient scheduled while in office for f/u imaging from 12/19/16 MMG at [facility]. Spoke with Laticia at [facility], patient scheduled for right breast diagnostic MMG and [place] on 12/30/16 arriving at 8:40am for 9am appointment. Patient is agreeable to date and time.  Routing to provider for final review. Patient is agreeable to disposition. Will close encounter.

## 2016-12-30 ENCOUNTER — Other Ambulatory Visit: Payer: Self-pay | Admitting: Obstetrics and Gynecology

## 2016-12-30 ENCOUNTER — Telehealth: Payer: Self-pay | Admitting: Obstetrics and Gynecology

## 2016-12-30 ENCOUNTER — Ambulatory Visit
Admission: RE | Admit: 2016-12-30 | Discharge: 2016-12-30 | Disposition: A | Discharge status: Home / Self Care | Payer: Managed Care, Other (non HMO) | Source: Ambulatory Visit | Attending: Obstetrics and Gynecology | Admitting: Obstetrics and Gynecology

## 2016-12-30 DIAGNOSIS — R928 Other abnormal and inconclusive findings on diagnostic imaging of breast: Secondary | ICD-10-CM

## 2016-12-30 DIAGNOSIS — N6310 Unspecified lump in the right breast, unspecified quadrant: Secondary | ICD-10-CM

## 2016-12-30 NOTE — Telephone Encounter (Signed)
Patient has a question.  No other information given.

## 2016-12-30 NOTE — Telephone Encounter (Signed)
Spoke with patient, patient calling about daughter, will close this encounter.

## 2017-01-02 ENCOUNTER — Ambulatory Visit
Admission: RE | Admit: 2017-01-02 | Discharge: 2017-01-02 | Disposition: A | Discharge status: Home / Self Care | Payer: Managed Care, Other (non HMO) | Source: Ambulatory Visit | Attending: Obstetrics and Gynecology | Admitting: Obstetrics and Gynecology

## 2017-01-02 DIAGNOSIS — N6310 Unspecified lump in the right breast, unspecified quadrant: Secondary | ICD-10-CM

## 2017-03-07 ENCOUNTER — Emergency Department
Admission: EM | Admit: 2017-03-07 | Discharge: 2017-03-07 | Disposition: A | Discharge status: Home / Self Care | Payer: Managed Care, Other (non HMO) | Source: Home / Self Care | Attending: Family Medicine | Admitting: Family Medicine

## 2017-03-07 ENCOUNTER — Telehealth: Payer: Self-pay | Admitting: Obstetrics and Gynecology

## 2017-03-07 ENCOUNTER — Encounter: Payer: Self-pay | Admitting: *Deleted

## 2017-03-07 DIAGNOSIS — R300 Dysuria: Secondary | ICD-10-CM

## 2017-03-07 DIAGNOSIS — N3001 Acute cystitis with hematuria: Secondary | ICD-10-CM

## 2017-03-07 DIAGNOSIS — R197 Diarrhea, unspecified: Secondary | ICD-10-CM

## 2017-03-07 LAB — POCT URINALYSIS DIP (MANUAL ENTRY)
BILIRUBIN UA: NEGATIVE mg/dL
Bilirubin, UA: NEGATIVE
Glucose, UA: NEGATIVE mg/dL
Nitrite, UA: NEGATIVE
PROTEIN UA: NEGATIVE mg/dL
Spec Grav, UA: 1.025 (ref 1.010–1.025)
UROBILINOGEN UA: 0.2 U/dL
pH, UA: 7 (ref 5.0–8.0)

## 2017-03-07 MED ORDER — CIPROFLOXACIN HCL 500 MG PO TABS: 500.0000 mg | ORAL_TABLET | Freq: Two times a day (BID) | ORAL | 0 refills | Status: DC

## 2017-03-07 NOTE — Discharge Instructions (Signed)
Begin clear liquids (Pedialyte while having diarrhea) until improved, then advance to a BRAT diet (Bananas, Rice, Applesauce, Toast).  Then gradually resume a regular diet when tolerated.  Avoid milk products until well.  When stools become more formed, may take Imodium (loperamide) once or twice daily to decrease stool frequency.  °If symptoms become significantly worse during the night or over the weekend, proceed to the local emergency room.  °

## 2017-03-07 NOTE — Telephone Encounter (Signed)
Patient recently came back from vacation and started having diarrhea. She noticed blood in her urine over the weekend.

## 2017-03-07 NOTE — ED Provider Notes (Signed)
[facility]    CSN: [id number] Arrival date & time: 03/07/17  1115     History   Chief Complaint Chief Complaint  Patient presents with  . Urinary Tract Infection  . Diarrhea    HPI Valerie [name] is a 41 y.o. female.   Patient presents with two complaints: 1)  She complains of watery diarrhea for one week.  She returned from the [place] 1.5 weeks ago, and her husband has had similar symptoms.  No abdominal pain.  No fevers, chills, and sweats. 2)  She complains of dysuria for 5 days, with frequency, mild low back ache, and minimal hematuria.     The history is provided by the patient.  Diarrhea  Quality:  Watery Severity:  Moderate Onset quality:  Sudden Duration:  1 week Timing:  Intermittent Progression:  Unchanged Relieved by:  Nothing Exacerbated by: eating. Associated symptoms: no abdominal pain, no arthralgias, no chills, no diaphoresis, no fever, no headaches, no myalgias and no vomiting   Risk factors: sick contacts and travel to endemic area   Risk factors: no recent antibiotic use and no suspicious food intake     Past Medical History:  Diagnosis Date  . Anemia    Hx blood transfusion 3-33yrs ago and iron infusion last month 03/2012  . Anxiety   . Blood transfusion without reported diagnosis 2013  . Endometriosis   . Liver disorder    fatty liver disease  . PONV (postoperative nausea and vomiting)   . Seasonal allergies   . STD (sexually transmitted disease)    Tx'd for Chlamydia at age 3  . Thyroid disease     Patient Active Problem List   Diagnosis Date Noted  . ANEMIA-IRON DEFICIENCY 09/04/2008    Past Surgical History:  Procedure Laterality Date  . ABDOMINAL HYSTERECTOMY    . CYSTOSCOPY  05/03/2012   Procedure: CYSTOSCOPY;  Surgeon: Loney Laurence, MD;  Location: [facility];  Service: Gynecology;  Laterality: N/A;  . DIAGNOSTIC LAPAROSCOPY    . SALPINGOOPHORECTOMY  05/03/2012   Procedure: SALPINGO OOPHERECTOMY;   Surgeon: Loney Laurence, MD;  Location: [facility];  Service: Gynecology;  Laterality: Bilateral;  . THYROID SURGERY     removed    OB History    Gravida Para Term Preterm AB Living   3 3 3  0 0 3   SAB TAB Ectopic Multiple Live Births   0 0 0 0 3       Home Medications    Prior to Admission medications   Medication Sig Start Date End Date Taking? Authorizing Provider  ciprofloxacin (CIPRO) 500 MG tablet Take 1 tablet (500 mg total) by mouth 2 (two) times daily. 03/07/17   Lattie Haw, MD  estradiol (ESTRACE) 1 MG tablet Take 1 tablet (1 mg total) by mouth daily. 06/17/16   Patton Salles, MD  fexofenadine-pseudoephedrine (ALLEGRA-D 24) 180-240 MG per 24 hr tablet Take 1 tablet by mouth daily.    [provider]  PRASTERONE, DHEA, PO Take 15 mg by mouth daily.    [provider]  sertraline (ZOLOFT) 25 MG tablet Take 25 mg by mouth daily. Take 3 tablets daily.    [provider]  thyroid (ARMOUR) 65 MG tablet Take 65 mg by mouth daily. Nature-Thyroid    [provider]    Family History Family History  Problem Relation Age of Onset  . Hypertension Mother   . Kawasaki disease Brother  Dec 9 mo.  . Thyroid disease Sister   . Breast cancer Paternal Grandmother   . COPD Paternal Grandmother        dec  . Emphysema Paternal Grandmother   . Hypertension Maternal Grandmother   . Diabetes Maternal Grandmother   . Diabetes Paternal Grandfather     Social History Social History  Substance Use Topics  . Smoking status: Never Smoker  . Smokeless tobacco: Never Used  . Alcohol use 1.0 oz/week    2 Standard drinks or equivalent per week     Comment: rarely     Allergies   Vicodin [hydrocodone-acetaminophen]   Review of Systems Review of Systems  Constitutional: Negative for chills, diaphoresis and fever.  Gastrointestinal: Positive for diarrhea. Negative for abdominal pain and vomiting.  Genitourinary: Positive for  dysuria, frequency, hematuria and urgency. Negative for difficulty urinating, pelvic pain and vaginal bleeding.  Musculoskeletal: Negative for arthralgias and myalgias.  Neurological: Negative for headaches.  All other systems reviewed and are negative.    Physical Exam Triage Vital Signs ED Triage Vitals [03/07/17 1138]  Enc Vitals Group     BP 125/81     Pulse Rate 91     Resp      Temp 98.3 F (36.8 C)     Temp Source Oral     SpO2 99 %     Weight 190 lb (86.2 kg)     Height      Head Circumference      Peak Flow      Pain Score 0     Pain Loc      Pain Edu?      Excl. in GC?    No data found.   Updated Vital Signs BP 125/81 (BP Location: Left Arm)   Pulse 91   Temp 98.3 F (36.8 C) (Oral)   Wt 190 lb (86.2 kg)   LMP 04/05/2012 (Approximate)   SpO2 99%   BMI 34.20 kg/m   Visual Acuity Right Eye Distance:   Left Eye Distance:   Bilateral Distance:    Right Eye Near:   Left Eye Near:    Bilateral Near:     Physical Exam Nursing notes and Vital Signs reviewed. Appearance:  Patient appears stated age, and in no acute distress Eyes:  Pupils are equal, round, and reactive to light and accomodation.  Extraocular movement is intact.  Conjunctivae are not inflamed  Ears:  Canals normal.  Tympanic membranes normal.  Nose:  Normal turbinates.  No sinus tenderness.    Pharynx:  Normal; moist mucous membranes  Neck:  Supple.  No adenopathy.  Lungs:  Clear to auscultation.  Breath sounds are equal.  Moving air well. Heart:  Regular rate and rhythm without murmurs, rubs, or gallops.  Abdomen:  Nontender without masses or hepatosplenomegaly.  Bowel sounds are present.  No CVA or flank tenderness.  Extremities:  No edema.  Skin:  No rash present.    UC Treatments / Results  Labs (all labs ordered are listed, but only abnormal results are displayed) Labs Reviewed  POCT URINALYSIS DIP (MANUAL ENTRY) - Abnormal; Notable for the following:       Result Value    Clarity, UA cloudy (*)    Blood, UA large (*)    Leukocytes, UA Small (1+) (*)    All other components within normal limits  URINE CULTURE    EKG  EKG Interpretation None       Radiology No results found.  Procedures Procedures (including critical care time)  Medications Ordered in UC Medications - No data to display   Initial Impression / Assessment and Plan / UC Course  I have reviewed the triage vital signs and the nursing notes.  Pertinent labs & imaging results that were available during my care of the patient were reviewed by me and considered in my medical decision making (see chart for details).    Suspect "traveller's diarrhea." Begin empiric Cipro 500mg  BID.  Urine culture pending. Begin clear liquids (Pedialyte while having diarrhea) until improved, then advance to a [employer] (Bananas, Rice, Applesauce, Toast).  Then gradually resume a regular diet when tolerated.  Avoid milk products until well.  When stools become more formed, may take Imodium (loperamide) once or twice daily to decrease stool frequency.  If symptoms become significantly worse during the night or over the weekend, proceed to the local emergency room.  Followup with Family Doctor if not improved in one week.     Final Clinical Impressions(s) / UC Diagnoses   Final diagnoses:  Dysuria  Acute cystitis with hematuria  Diarrhea of presumed infectious origin    New Prescriptions New Prescriptions   CIPROFLOXACIN (CIPRO) 500 MG TABLET    Take 1 tablet (500 mg total) by mouth 2 (two) times daily.     Lattie Haw, MD 03/15/17 [phone]

## 2017-03-07 NOTE — Telephone Encounter (Signed)
Spoke with patient. Patient states she returned from family vacation on 6/24 from [place], whole family has been sick with diarrhea and nausea since return. Patient reports new symptoms of blood in urine, pressure, urinary frequency and headache. Denies fever/chills.  Advised patient further evaluation is needed,  seek care at pcp, urgent care or local ER for further evaluation. Patient states pcp has recommended urgent care.   Advised patient Dr. Edward JollySilva is out of the office today, will review with covering provider and return call if any additional recommendations provided. Patient thankful for return call and verbalizes understanding.  Routing to covering provider for final review. Patient is agreeable to disposition. Will close encounter.   Cc: Dr. Edward JollySilva

## 2017-03-07 NOTE — ED Triage Notes (Addendum)
Patient c/o diarrhea x 7 days. She [place] from [place] 1 1[place]2 weeks ago. Her husband is experiencing diarrhea as well. 4 days of dysuria and urinary frequency. Also c/o HA and nausea.

## 2017-03-09 ENCOUNTER — Telehealth: Payer: Self-pay | Admitting: Emergency Medicine

## 2017-03-09 LAB — URINE CULTURE: ORGANISM ID, BACTERIA: NO GROWTH

## 2017-03-09 NOTE — Telephone Encounter (Signed)
Patient is better, no growth on culture

## 2017-07-13 NOTE — Progress Notes (Deleted)
41 y.o. 553P3003 Married Caucasian female here for annual exam.    PCP:     Patient's last menstrual period was 04/05/2012 (approximate).           Sexually active: {yes no:314532}  The current method of family planning is status post hysterectomy.    Exercising: {yes no:314532}  {types:19826} Smoker:  no  Health Maintenance: Pap: 01-12-11 Neg History of abnormal Pap: *** Yes, in her early 20's MMG: 12-16-16 Density C/Rt.Br.possible mass;Lt.Br.neg--further eval.needed. 12-30-16 Rt.Br.Diag. And U/S revealed indeterminate 9mm mass inner Rt.Br.Bx performed and pathology showed fibroadenoma of Rt.Br./TBC Colonoscopy:  n/a BMD:   n/a  Result  n/a TDaP:  2012 Gardasil:   no HIV: 2002 Neg in pregnancy Hep C:n/a Screening Labs:  Hb today: ***, Urine today: ***   reports that  has never smoked. she has never used smokeless tobacco. She reports that she drinks about 1.0 oz of alcohol per week. She reports that she does not use drugs.  Past Medical History:  Diagnosis Date  . Anemia    Hx blood transfusion 3-43106yrs ago and iron infusion last month 03/2012  . Anxiety   . Blood transfusion without reported diagnosis 2013  . Endometriosis   . Liver disorder    fatty liver disease  . PONV (postoperative nausea and vomiting)   . Seasonal allergies   . STD (sexually transmitted disease)    Tx'd for Chlamydia at age 41  . Thyroid disease     Past Surgical History:  Procedure Laterality Date  . ABDOMINAL HYSTERECTOMY    . DIAGNOSTIC LAPAROSCOPY    . THYROID SURGERY     removed    Current Outpatient Medications  Medication Sig Dispense Refill  . ciprofloxacin (CIPRO) 500 MG tablet Take 1 tablet (500 mg total) by mouth 2 (two) times daily. 10 tablet 0  . estradiol (ESTRACE) 1 MG tablet Take 1 tablet (1 mg total) by mouth daily. 30 tablet 11  . fexofenadine-pseudoephedrine (ALLEGRA-D 24) 180-240 MG per 24 hr tablet Take 1 tablet by mouth daily.    Marland Kitchen. PRASTERONE, DHEA, PO Take 15 mg by mouth  daily.    . sertraline (ZOLOFT) 25 MG tablet Take 25 mg by mouth daily. Take 3 tablets daily.    Marland Kitchen. thyroid (ARMOUR) 65 MG tablet Take 65 mg by mouth daily. Nature-Thyroid     No current facility-administered medications for this visit.     Family History  Problem Relation Age of Onset  . Hypertension Mother   . Kawasaki disease Brother        Dec 9 mo.  . Thyroid disease Sister   . Breast cancer Paternal Grandmother   . COPD Paternal Grandmother        dec  . Emphysema Paternal Grandmother   . Hypertension Maternal Grandmother   . Diabetes Maternal Grandmother   . Diabetes Paternal Grandfather     ROS:  Pertinent items are noted in HPI.  Otherwise, a comprehensive ROS was negative.  Exam:   LMP 04/05/2012 (Approximate)     General appearance: alert, cooperative and appears stated age Head: Normocephalic, without obvious abnormality, atraumatic Neck: no adenopathy, supple, symmetrical, trachea midline and thyroid normal to inspection and palpation Lungs: clear to auscultation bilaterally Breasts: normal appearance, no masses or tenderness, No nipple retraction or dimpling, No nipple discharge or bleeding, No axillary or supraclavicular adenopathy Heart: regular rate and rhythm Abdomen: soft, non-tender; no masses, no organomegaly Extremities: extremities normal, atraumatic, no cyanosis or edema Skin: Skin color,  texture, turgor normal. No rashes or lesions Lymph nodes: Cervical, supraclavicular, and axillary nodes normal. No abnormal inguinal nodes palpated Neurologic: Grossly normal  Pelvic: External genitalia:  no lesions              Urethra:  normal appearing urethra with no masses, tenderness or lesions              Bartholins and Skenes: normal                 Vagina: normal appearing vagina with normal color and discharge, no lesions              Cervix: no lesions              Pap taken: {yes no:314532} Bimanual Exam:  Uterus:  normal size, contour, position,  consistency, mobility, non-tender              Adnexa: no mass, fullness, tenderness              Rectal exam: {yes no:314532}.  Confirms.              Anus:  normal sphincter tone, no lesions  Chaperone was present for exam.  Assessment:   Well woman visit with normal exam.   Plan: Mammogram screening discussed. Recommended self breast awareness. Pap and HR HPV as above. Guidelines for Calcium, Vitamin D, regular exercise program including cardiovascular and weight bearing exercise.   Follow up annually and prn.   Additional counseling given.  {yes [id number]:314532}. _______ minutes face to face time of which over 50% was spent in counseling.    After visit summary provided.

## 2017-07-14 ENCOUNTER — Encounter: Payer: Self-pay | Admitting: Obstetrics and Gynecology

## 2017-07-14 ENCOUNTER — Ambulatory Visit: Payer: Managed Care, Other (non HMO) | Admitting: Obstetrics and Gynecology

## 2017-08-24 ENCOUNTER — Other Ambulatory Visit: Payer: Self-pay | Admitting: Obstetrics and Gynecology

## 2017-08-24 NOTE — Telephone Encounter (Signed)
Medication refill request: Estradiol  Last AEX:  06/17/16 BS  Next AEX: 10/06/17  Last MMG (if hormonal medication request): 01/04/17 right breast biopsy- fibroadenoma  Refill authorized: 06/17/16 #30, 11 RF. Today, please advise.

## 2017-10-06 ENCOUNTER — Ambulatory Visit: Payer: Managed Care, Other (non HMO) | Admitting: Obstetrics and Gynecology

## 2017-10-06 ENCOUNTER — Encounter: Payer: Self-pay | Admitting: Obstetrics and Gynecology

## 2017-10-06 ENCOUNTER — Other Ambulatory Visit: Payer: Self-pay

## 2017-10-06 VITALS — BP 118/66 | HR 72 | Resp 16 | Ht 62.5 in | Wt 197.0 lb

## 2017-10-06 DIAGNOSIS — Z01419 Encounter for gynecological examination (general) (routine) without abnormal findings: Secondary | ICD-10-CM | POA: Diagnosis not present

## 2017-10-06 MED ORDER — ESTRADIOL 0.1 MG/24HR TD PTTW: 1.0000 | MEDICATED_PATCH | TRANSDERMAL | 11 refills | Status: DC

## 2017-10-06 NOTE — Progress Notes (Signed)
42 y.o. [id number] Married Hispanic female here for annual exam.    On ERT.  Having liver problems. Elevated LFTs due to fatty liver.   Some stress incontinence, depression, decrease libido.  Started Cymbalta recently for fibromyalgia.   Father deceased from liver failure. Hx ETOH use.   Step father has cancer.   PCP:  Marylene Land, PA  Patient's last menstrual period was 04/05/2012 (approximate).           Sexually active: Yes.    The current method of family planning is status post hysterectomy.    Exercising: No.  The patient does not participate in regular exercise at present. Smoker:  no  Health Maintenance: Pap:  01/12/11 Negative History of abnormal Pap:  Yes, years ago MMG:  12/30/16 w/ Rt breast US and Biopsy -- BIRADS 4 Suspicious -- see Epic for details. Fibroadenoma.  Colonoscopy:  n/a BMD:   n/a  Result  n/a TDaP:  2012 Gardasil:   n/a HIV: 2002 with pregnancy Hep C: none  Screening Labs: PCP   reports that  has never smoked. she has never used smokeless tobacco. She reports that she drinks about 1.0 oz of alcohol per week. She reports that she does not use drugs.  Past Medical History:  Diagnosis Date  . Anemia    Hx blood transfusion 3-51yrs ago and iron infusion last month 03/2012  . Anxiety   . Blood transfusion without reported diagnosis 2013  . Endometriosis   . Liver disorder    fatty liver disease  . PONV (postoperative nausea and vomiting)   . Seasonal allergies   . STD (sexually transmitted disease)    Tx'd for Chlamydia at age 63  . Thyroid disease     Past Surgical History:  Procedure Laterality Date  . ABDOMINAL HYSTERECTOMY    . CYSTOSCOPY  05/03/2012   Procedure: CYSTOSCOPY;  Surgeon: Loney Laurence, MD;  Location: [facility];  Service: Gynecology;  Laterality: N/A;  . DIAGNOSTIC LAPAROSCOPY    . SALPINGOOPHORECTOMY  05/03/2012   Procedure: SALPINGO OOPHERECTOMY;  Surgeon: Loney Laurence, MD;  Location: [facility];  Service: Gynecology;   Laterality: Bilateral;  . THYROID SURGERY     removed    Current Outpatient Medications  Medication Sig Dispense Refill  . Albuterol Sulfate 108 (90 Base) MCG/ACT AEPB Inhale into the lungs as needed.    . DULoxetine (CYMBALTA) 60 MG capsule Take by mouth daily.    Marland Kitchen estradiol (ESTRACE) 1 MG tablet TAKE 1 TABLET (1 MG TOTAL) BY MOUTH DAILY. 30 tablet 1  . fexofenadine-pseudoephedrine (ALLEGRA-D 24) 180-240 MG per 24 hr tablet Take 1 tablet by mouth daily.    . rizatriptan (MAXALT) 10 MG tablet Take by mouth as needed.    . thyroid (ARMOUR) 65 MG tablet Take 65 mg by mouth daily. Nature-Thyroid    . tiZANidine (ZANAFLEX) 4 MG tablet Take 1/2-1 tablet by mouth at bedtime PRN muscle pain/spasm(s)     No current facility-administered medications for this visit.     Family History  Problem Relation Age of Onset  . Hypertension Mother   . Kawasaki disease Brother        Dec 9 mo.  . Thyroid disease Sister   . Breast cancer Paternal Grandmother   . COPD Paternal Grandmother        dec  . Emphysema Paternal Grandmother   . Hypertension Maternal Grandmother   . Diabetes Maternal Grandmother   . Diabetes Paternal Grandfather  ROS:  Pertinent items are noted in HPI.  Otherwise, a comprehensive ROS was negative.  Exam:   BP 118/66 (BP Location: Right Arm, Patient Position: Sitting, Cuff Size: Normal)   Pulse 72   Resp 16   Ht 5' 2.5" (1.588 m)   Wt 197 lb (89.4 kg)   LMP 04/05/2012 (Approximate)   BMI 35.46 kg/m     General appearance: alert, cooperative and appears stated age Head: Normocephalic, without obvious abnormality, atraumatic Neck: no adenopathy, supple, symmetrical, trachea midline and thyroid normal to inspection and palpation Lungs: clear to auscultation bilaterally Breasts: normal appearance, no masses or tenderness, No nipple retraction or dimpling, No nipple discharge or bleeding, No axillary or supraclavicular adenopathy Heart: regular rate and  rhythm Abdomen: soft, non-tender; no masses, no organomegaly Extremities: extremities normal, atraumatic, no cyanosis or edema Skin: Skin color, texture, turgor normal. No rashes or lesions Lymph nodes: Cervical, supraclavicular, and axillary nodes normal. No abnormal inguinal nodes palpated Neurologic: Grossly normal  Pelvic: External genitalia:  no lesions              Urethra:  normal appearing urethra with no masses, tenderness or lesions              Bartholins and Skenes: normal                 Vagina: normal appearing vagina with normal color and discharge, no lesions              Cervix:  Absent.              Pap taken: No. Bimanual Exam:  Uterus: absent.              Adnexa: no mass, fullness, tenderness              Rectal exam: Yes.  .  Confirms.              Anus:  normal sphincter tone, no lesions  Chaperone was present for exam.  Assessment:   Well woman visit with normal exam. Status post TAH.  On ERT.  Fatty liver disease. GSI. On Cymbalta.  Plan: Mammogram screening discussed. Recommended self breast awareness. Pap and HR HPV as above. Guidelines for Calcium, Vitamin D, regular exercise program including cardiovascular and weight bearing exercise. Discussed Kegels, Impressa, weight loss. Doing an organized weight loss program.   Switch to vivelle dot 0.1 mg twice weekly.  Discussed risks of DVT, PE, stroke, and possible breast cancer. This will be better for her given her dx of fatty liver disease and abnormal LFTs.  Follow up annually and prn.   After visit summary provided.

## 2017-10-06 NOTE — Patient Instructions (Signed)
Considering seeing a hepatologist for liver care.   EXERCISE AND DIET:  We recommended that you start or continue a regular exercise program for good health. Regular exercise means any activity that makes your heart beat faster and makes you sweat.  We recommend exercising at least 30 minutes per day at least 3 days a week, preferably 4 or 5.  We also recommend a diet low in fat and sugar.  Inactivity, poor dietary choices and obesity can cause diabetes, heart attack, stroke, and kidney damage, among others.    ALCOHOL AND SMOKING:  Women should limit their alcohol intake to no more than 7 drinks/beers/glasses of wine (combined, not each!) per week. Moderation of alcohol intake to this level decreases your risk of breast cancer and liver damage. And of course, no recreational drugs are part of a healthy lifestyle.  And absolutely no smoking or even second hand smoke. Most people know smoking can cause heart and lung diseases, but did you know it also contributes to weakening of your bones? Aging of your skin?  Yellowing of your teeth and nails?  CALCIUM AND VITAMIN D:  Adequate intake of calcium and Vitamin D are recommended.  The recommendations for exact amounts of these supplements seem to change often, but generally speaking 600 mg of calcium (either carbonate or citrate) and 800 units of Vitamin D per day seems prudent. Certain women may benefit from higher intake of Vitamin D.  If you are among these women, your doctor will have told you during your visit.    PAP SMEARS:  Pap smears, to check for cervical cancer or precancers,  have traditionally been done yearly, although recent scientific advances have shown that most women can have pap smears less often.  However, every woman still should have a physical exam from her gynecologist every year. It will include a breast check, inspection of the vulva and vagina to check for abnormal growths or skin changes, a visual exam of the cervix, and then an exam  to evaluate the size and shape of the uterus and ovaries.  And after 43 years of age, a rectal exam is indicated to check for rectal cancers. We will also provide age appropriate advice regarding health maintenance, like when you should have certain vaccines, screening for sexually transmitted diseases, bone density testing, colonoscopy, mammograms, etc.   MAMMOGRAMS:  All women over 43 years old should have a yearly mammogram. Many facilities now offer a "3D" mammogram, which may cost around $50 extra out of pocket. If possible,  we recommend you accept the option to have the 3D mammogram performed.  It both reduces the number of women who will be called back for extra views which then turn out to be normal, and it is better than the routine mammogram at detecting truly abnormal areas.    COLONOSCOPY:  Colonoscopy to screen for colon cancer is recommended for all women at age 43.  We know, you hate the idea of the prep.  We agree, BUT, having colon cancer and not knowing it is worse!!  Colon cancer so often starts as a polyp that can be seen and removed at colonscopy, which can quite literally save your life!  And if your first colonoscopy is normal and you have no family history of colon cancer, most women don't have to have it again for 10 years.  Once every ten years, you can do something that may end up saving your life, right?  We will be happy to  help you get it scheduled when you are ready.  Be sure to check your insurance coverage so you understand how much it will cost.  It may be covered as a preventative service at no cost, but you should check your particular policy.

## 2017-11-13 ENCOUNTER — Other Ambulatory Visit: Payer: Self-pay | Admitting: Obstetrics and Gynecology

## 2018-05-10 ENCOUNTER — Telehealth: Payer: Self-pay | Admitting: Obstetrics and Gynecology

## 2018-05-10 NOTE — Telephone Encounter (Signed)
Patient called requesting a call back to help her figure out what kind of doctor she may need a referral to.

## 2018-05-10 NOTE — Telephone Encounter (Signed)
Spoke with patient. She is having hand pain. Does not currently have a PCP.  She is a Sales executive so uses hands frequently.  Discussed options available. Urgent care for evaluation/potential xray. Establish primary care and see their office, number given for [place] and./or urgent care orthopedics options in [facility] [place] has after hours walk in urgent care.   Advised will send message to Dr Edward Jolly to review and would call back if any further advice regarding hand pain. Patient agreeable.

## 2018-05-11 NOTE — Telephone Encounter (Signed)
I would recommend a hand specialist at an orthopedic office.

## 2018-05-11 NOTE — Telephone Encounter (Signed)
Call to patient. Advised of recommendation from Dr Edward Jolly.   Encounter closed.

## 2018-09-04 ENCOUNTER — Other Ambulatory Visit: Payer: Self-pay | Admitting: Obstetrics and Gynecology

## 2018-09-04 NOTE — Telephone Encounter (Signed)
Patient needs to have her mammogram updated for refills.  I do not seen this for 2019.

## 2018-09-04 NOTE — Telephone Encounter (Signed)
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  Refill authorized: #16 with 0RF to get her to her AEX

## 2018-09-04 NOTE — Telephone Encounter (Signed)
Spoke with patient, advised as seen below per Dr. Edward JollySilva. Patient has not had an updated MMG, she will call [facility] directly to schedule, declines assistance with scheduling. Patient aware once MMG completed and results reviewed by Dr. Edward JollySilva, will then be advised on HRT refills. Patient verbalizes understanding and is agreeable, thankful for call.   Rx refused.   Routing to provider for final review. Patient is agreeable to disposition. Will close encounter.

## 2018-09-11 ENCOUNTER — Other Ambulatory Visit: Payer: Self-pay | Admitting: Obstetrics and Gynecology

## 2018-09-11 DIAGNOSIS — Z1231 Encounter for screening mammogram for malignant neoplasm of breast: Secondary | ICD-10-CM

## 2018-09-12 ENCOUNTER — Telehealth: Payer: Self-pay | Admitting: Obstetrics and Gynecology

## 2018-09-12 NOTE — Telephone Encounter (Signed)
Patient called with a breast lump.

## 2018-09-12 NOTE — Telephone Encounter (Signed)
Call to patient. Patient states she noticed a lump in her right breast "a few weeks ago." Last MMG was 12/2016. Patient denies pain, redness or nipple discharge. Patient has had a hysterectomy. OV recommended for further evaluation. Patient agreeable, requests a Friday appointment. Patient scheduled for Friday 09-14-2018 at 1430. Patient agreeable to date and time of appointment.   Routing to provider and will close encounter.

## 2018-09-13 NOTE — Progress Notes (Deleted)
GYNECOLOGY  VISIT   HPI: 43 y.o.   Married  Hispanic  female   [phone] with Patient's last menstrual period was 04/05/2012 (approximate).   here for right breast mass.   GYNECOLOGIC HISTORY: Patient's last menstrual period was 04/05/2012 (approximate). Contraception: Hysterectomy Menopausal hormone therapy: Vivelle Dot Last mammogram: 12/30/16 w/ Rt breast US and Biopsy -- BIRADS 4 Suspicious -- see Epic for details. Fibroadenoma.   Last pap smear:  01/12/11 Negative        OB History    Gravida  3   Para  3   Term  3   Preterm  0   AB  0   Living  3     SAB  0   TAB  0   Ectopic  0   Multiple  0   Live Births  3              Patient Active Problem List   Diagnosis Date Noted  . ANEMIA-IRON DEFICIENCY 09/04/2008    Past Medical History:  Diagnosis Date  . Anemia    Hx blood transfusion 3-37yrs ago and iron infusion last month 03/2012  . Anxiety   . Blood transfusion without reported diagnosis 2013  . Endometriosis   . Liver disorder    fatty liver disease  . PONV (postoperative nausea and vomiting)   . Seasonal allergies   . STD (sexually transmitted disease)    Tx'd for Chlamydia at age 84  . Thyroid disease     Past Surgical History:  Procedure Laterality Date  . ABDOMINAL HYSTERECTOMY    . CYSTOSCOPY  05/03/2012   Procedure: CYSTOSCOPY;  Surgeon: Loney Laurence, MD;  Location: [facility];  Service: Gynecology;  Laterality: N/A;  . DIAGNOSTIC LAPAROSCOPY    . SALPINGOOPHORECTOMY  05/03/2012   Procedure: SALPINGO OOPHERECTOMY;  Surgeon: Loney Laurence, MD;  Location: [facility];  Service: Gynecology;  Laterality: Bilateral;  . THYROID SURGERY     removed    Current Outpatient Medications  Medication Sig Dispense Refill  . Albuterol Sulfate 108 (90 Base) MCG/ACT AEPB Inhale into the lungs as needed.    . DULoxetine (CYMBALTA) 60 MG capsule Take by mouth daily.    Marland Kitchen estradiol (VIVELLE-DOT) 0.1 MG/24HR patch Place 1 patch (0.1 mg total) onto the  skin 2 (two) times a week. 8 patch 11  . fexofenadine-pseudoephedrine (ALLEGRA-D 24) 180-240 MG per 24 hr tablet Take 1 tablet by mouth daily.    . rizatriptan (MAXALT) 10 MG tablet Take by mouth as needed.    . thyroid (ARMOUR) 65 MG tablet Take 65 mg by mouth daily. Nature-Thyroid    . tiZANidine (ZANAFLEX) 4 MG tablet Take 1/2-1 tablet by mouth at bedtime PRN muscle pain/spasm(s)     No current facility-administered medications for this visit.      ALLERGIES: Vicodin [hydrocodone-acetaminophen]  Family History  Problem Relation Age of Onset  . Hypertension Mother   . Kawasaki disease Brother        Dec 9 mo.  . Thyroid disease Sister   . Breast cancer Paternal Grandmother   . COPD Paternal Grandmother        dec  . Emphysema Paternal Grandmother   . Hypertension Maternal Grandmother   . Diabetes Maternal Grandmother   . Diabetes Paternal Grandfather     Social History   Socioeconomic History  . Marital status: Married    Spouse name: Not on file  . Number of children: Not on file  .  Years of education: Not on file  . Highest education level: Not on file  Occupational History  . Not on file  Social Needs  . Financial resource strain: Not on file  . Food insecurity:    Worry: Not on file    Inability: Not on file  . Transportation needs:    Medical: Not on file    Non-medical: Not on file  Tobacco Use  . Smoking status: Never Smoker  . Smokeless tobacco: Never Used  Substance and Sexual Activity  . Alcohol use: Yes    Alcohol/week: 2.0 standard drinks    Types: 2 Standard drinks or equivalent per week    Comment: rarely  . Drug use: No  . Sexual activity: Yes    Partners: Male    Birth control/protection: Surgical    Comment: hyst-left ovary remains  Lifestyle  . Physical activity:    Days per week: Not on file    Minutes per session: Not on file  . Stress: Not on file  Relationships  . Social connections:    Talks on phone: Not on file    Gets  together: Not on file    Attends religious service: Not on file    Active member of club or organization: Not on file    Attends meetings of clubs or organizations: Not on file    Relationship status: Not on file  . Intimate partner violence:    Fear of current or ex partner: Not on file    Emotionally abused: Not on file    Physically abused: Not on file    Forced sexual activity: Not on file  Other Topics Concern  . Not on file  Social History Narrative  . Not on file    Review of Systems  PHYSICAL EXAMINATION:    LMP 04/05/2012 (Approximate)     General appearance: alert, cooperative and appears stated age Head: Normocephalic, without obvious abnormality, atraumatic Neck: no adenopathy, supple, symmetrical, trachea midline and thyroid normal to inspection and palpation Lungs: clear to auscultation bilaterally Breasts: normal appearance, no masses or tenderness, No nipple retraction or dimpling, No nipple discharge or bleeding, No axillary or supraclavicular adenopathy Heart: regular rate and rhythm Abdomen: soft, non-tender, no masses,  no organomegaly Extremities: extremities normal, atraumatic, no cyanosis or edema Skin: Skin color, texture, turgor normal. No rashes or lesions Lymph nodes: Cervical, supraclavicular, and axillary nodes normal. No abnormal inguinal nodes palpated Neurologic: Grossly normal  Pelvic: External genitalia:  no lesions              Urethra:  normal appearing urethra with no masses, tenderness or lesions              Bartholins and Skenes: normal                 Vagina: normal appearing vagina with normal color and discharge, no lesions              Cervix: no lesions                Bimanual Exam:  Uterus:  normal size, contour, position, consistency, mobility, non-tender              Adnexa: no mass, fullness, tenderness              Rectal exam: {yes no:314532}.  Confirms.              Anus:  normal sphincter tone, no lesions  Chaperone was  present for exam.  ASSESSMENT     PLAN     An After Visit Summary was printed and given to the patient.  ______ minutes face to face time of which over 50% was spent in counseling.

## 2018-09-14 ENCOUNTER — Encounter: Payer: Self-pay | Admitting: Obstetrics and Gynecology

## 2018-09-14 ENCOUNTER — Ambulatory Visit: Payer: Managed Care, Other (non HMO) | Admitting: Obstetrics and Gynecology

## 2018-09-14 ENCOUNTER — Telehealth: Payer: Self-pay | Admitting: Obstetrics and Gynecology

## 2018-09-14 NOTE — Telephone Encounter (Signed)
Patient canceled her "breast mass" appointment today due her son's appointment. I offered other times today and next week, she declined. She wished to reschedule to next Friday. She rescheduled to 09/21/18 at 9:00 am. [facility] policy followed.

## 2018-09-14 NOTE — Telephone Encounter (Signed)
Thank you for the update. You may close the encounter. 

## 2018-09-21 ENCOUNTER — Other Ambulatory Visit: Payer: Self-pay

## 2018-09-21 ENCOUNTER — Ambulatory Visit (INDEPENDENT_AMBULATORY_CARE_PROVIDER_SITE_OTHER): Payer: Managed Care, Other (non HMO) | Admitting: Obstetrics and Gynecology

## 2018-09-21 ENCOUNTER — Encounter: Payer: Self-pay | Admitting: Obstetrics and Gynecology

## 2018-09-21 VITALS — BP 112/78 | HR 66 | Ht 62.5 in | Wt 191.0 lb

## 2018-09-21 DIAGNOSIS — N644 Mastodynia: Secondary | ICD-10-CM | POA: Diagnosis not present

## 2018-09-21 DIAGNOSIS — N6310 Unspecified lump in the right breast, unspecified quadrant: Secondary | ICD-10-CM | POA: Diagnosis not present

## 2018-09-21 NOTE — Progress Notes (Signed)
Patient is scheduled for Bilateral Breast Diagnostic Mammogram and R Breast Ultrasound at [facility] on 09/26/2018 at 1110 . Patient agreeable to time/date/location.

## 2018-09-21 NOTE — Progress Notes (Signed)
GYNECOLOGY  VISIT   HPI: 43 y.o.   Married  Hispanic  female   [phone] with Patient's last menstrual period was 04/05/2012 (approximate).   here for right breast mass at 12:00 just above the areola.   Felt a mass a couple of weeks ago.  Can feel occasional pain in the breast and can have itching.   She has a known fibroadenoma of the right beast.   Has mildly elevated LFTs.   GYNECOLOGIC HISTORY: Patient's last menstrual period was 04/05/2012 (approximate). Contraception: Hysterectomy Menopausal hormone therapy: Vivelle Dot Last mammogram:  12/30/16 w/ Rt breast US and Biopsy -- BIRADS 4 Suspicious -- see Epic for details. Fibroadenoma. Last pap smear: 01-12-11 Neg        OB History    Gravida  3   Para  3   Term  3   Preterm  0   AB  0   Living  3     SAB  0   TAB  0   Ectopic  0   Multiple  0   Live Births  3              Patient Active Problem List   Diagnosis Date Noted  . ANEMIA-IRON DEFICIENCY 09/04/2008    Past Medical History:  Diagnosis Date  . Anemia    Hx blood transfusion 3-9yrs ago and iron infusion last month 03/2012  . Anxiety   . Blood transfusion without reported diagnosis 2013  . Endometriosis   . Liver disorder    fatty liver disease  . PONV (postoperative nausea and vomiting)   . Seasonal allergies   . STD (sexually transmitted disease)    Tx'd for Chlamydia at age 55  . Thyroid disease     Past Surgical History:  Procedure Laterality Date  . ABDOMINAL HYSTERECTOMY    . CYSTOSCOPY  05/03/2012   Procedure: CYSTOSCOPY;  Surgeon: Loney Laurence, MD;  Location: [facility];  Service: Gynecology;  Laterality: N/A;  . DIAGNOSTIC LAPAROSCOPY    . SALPINGOOPHORECTOMY  05/03/2012   Procedure: SALPINGO OOPHERECTOMY;  Surgeon: Loney Laurence, MD;  Location: [facility];  Service: Gynecology;  Laterality: Bilateral;  . THYROID SURGERY     removed    Current Outpatient Medications  Medication Sig Dispense Refill  . Albuterol Sulfate  108 (90 Base) MCG/ACT AEPB Inhale into the lungs as needed.    . DULoxetine (CYMBALTA) 60 MG capsule Take by mouth daily.    Marland Kitchen estradiol (VIVELLE-DOT) 0.1 MG/24HR patch Place 1 patch (0.1 mg total) onto the skin 2 (two) times a week. 8 patch 11  . fexofenadine-pseudoephedrine (ALLEGRA-D 24) 180-240 MG per 24 hr tablet Take 1 tablet by mouth daily.    . Lorcaserin HCl ER (BELVIQ XR) 20 MG TB24 Take 1 tablet by mouth daily.    Marland Kitchen omeprazole (PRILOSEC) 20 MG capsule Take 1 capsule by mouth daily.    . rizatriptan (MAXALT) 10 MG tablet Take by mouth as needed.    . thyroid (ARMOUR) 60 MG tablet Take 60 mg by mouth daily before breakfast. Takes Monday- Friday    . thyroid (ARMOUR) 90 MG tablet Takes Sat & Sun     No current facility-administered medications for this visit.      ALLERGIES: Topiramate and Vicodin [hydrocodone-acetaminophen]  Family History  Problem Relation Age of Onset  . Hypertension Mother   . Kawasaki disease Brother        Dec 9 mo.  . Thyroid disease  Sister   . Breast cancer Paternal Grandmother   . COPD Paternal Grandmother        dec  . Emphysema Paternal Grandmother   . Hypertension Maternal Grandmother   . Diabetes Maternal Grandmother   . Diabetes Paternal Grandfather     Social History   Socioeconomic History  . Marital status: Married    Spouse name: Not on file  . Number of children: Not on file  . Years of education: Not on file  . Highest education level: Not on file  Occupational History  . Not on file  Social Needs  . Financial resource strain: Not on file  . Food insecurity:    Worry: Not on file    Inability: Not on file  . Transportation needs:    Medical: Not on file    Non-medical: Not on file  Tobacco Use  . Smoking status: Never Smoker  . Smokeless tobacco: Never Used  Substance and Sexual Activity  . Alcohol use: Yes    Alcohol/week: 2.0 standard drinks    Types: 2 Standard drinks or equivalent per week    Comment: rarely  .  Drug use: No  . Sexual activity: Yes    Partners: Male    Birth control/protection: Surgical    Comment: hyst-left ovary remains  Lifestyle  . Physical activity:    Days per week: Not on file    Minutes per session: Not on file  . Stress: Not on file  Relationships  . Social connections:    Talks on phone: Not on file    Gets together: Not on file    Attends religious service: Not on file    Active member of club or organization: Not on file    Attends meetings of clubs or organizations: Not on file    Relationship status: Not on file  . Intimate partner violence:    Fear of current or ex partner: Not on file    Emotionally abused: Not on file    Physically abused: Not on file    Forced sexual activity: Not on file  Other Topics Concern  . Not on file  Social History Narrative  . Not on file    Review of Systems  All other systems reviewed and are negative.   PHYSICAL EXAMINATION:    BP 112/78 (BP Location: Right Arm, Patient Position: Sitting, Cuff Size: Large)   Pulse 66   Ht 5' 2.5" (1.588 m)   Wt 191 lb (86.6 kg)   LMP 04/05/2012 (Approximate)   BMI 34.38 kg/m     General appearance: alert, cooperative and appears stated age   Breasts: normal appearance, no dominant masses.  Tender at 12:00. No nipple retraction or dimpling, No nipple discharge or bleeding, No axillary adenopathy.  Chaperone was present for exam.  ASSESSMENT  Right breast lump palpable by patient.  Right breast tenderness.  ERT patient.  Hx right breast fibroadenoma.   PLAN  Proceed with dx mammogram and possible right breast US.  FU here for annual exam.    An After Visit Summary was printed and given to the patient.  ___15___ minutes face to face time of which over 50% was spent in counseling.

## 2018-09-26 ENCOUNTER — Ambulatory Visit
Admission: RE | Admit: 2018-09-26 | Discharge: 2018-09-26 | Disposition: A | Discharge status: Home / Self Care | Payer: Managed Care, Other (non HMO) | Source: Ambulatory Visit | Attending: Obstetrics and Gynecology | Admitting: Obstetrics and Gynecology

## 2018-09-26 DIAGNOSIS — N6310 Unspecified lump in the right breast, unspecified quadrant: Secondary | ICD-10-CM

## 2018-09-26 DIAGNOSIS — N644 Mastodynia: Secondary | ICD-10-CM

## 2018-10-07 ENCOUNTER — Other Ambulatory Visit: Payer: Self-pay | Admitting: Obstetrics and Gynecology

## 2018-10-08 NOTE — Telephone Encounter (Signed)
Medication refill request: Vivelle Dot  Last AEX:  10/06/17 Next AEX: 11/30/18 Last MMG (if hormonal medication request): 09/26/2018 Birads 1 neg  Refill authorized: #8  with 1 RF to get her to her aex

## 2018-10-19 ENCOUNTER — Ambulatory Visit: Payer: Managed Care, Other (non HMO) | Admitting: Obstetrics and Gynecology

## 2018-11-30 ENCOUNTER — Ambulatory Visit: Payer: Managed Care, Other (non HMO) | Admitting: Obstetrics and Gynecology

## 2019-02-11 ENCOUNTER — Other Ambulatory Visit: Payer: Self-pay | Admitting: Obstetrics and Gynecology

## 2019-02-12 NOTE — Telephone Encounter (Signed)
Message left to return call to Carmisha Larusso at [phone].    

## 2019-02-15 NOTE — Telephone Encounter (Signed)
Medication refill request: Vivelle-dot Last AEX:  10-06-17 BS Next AEX: 04-19-2019 Last MMG (if hormonal medication request): 09-26-2018 BIRADS 2 benign Refill authorized: 10-08-2018, #8, 1RF. Please advise.   Spoke with patient and scheduled aex for 04-19-2019. Medication pended for #8, 1RF. Please refill if appropriate. Pharmacy confirmed as [facility] on [address] in [place].

## 2019-03-11 ENCOUNTER — Other Ambulatory Visit: Payer: Self-pay | Admitting: Obstetrics and Gynecology

## 2019-03-28 ENCOUNTER — Telehealth: Payer: Self-pay | Admitting: Obstetrics and Gynecology

## 2019-03-28 NOTE — Telephone Encounter (Signed)
Left message on voicemail to call and reschedule cancelled appointment. °

## 2019-04-19 ENCOUNTER — Ambulatory Visit: Payer: Managed Care, Other (non HMO) | Admitting: Obstetrics and Gynecology

## 2019-05-10 ENCOUNTER — Other Ambulatory Visit: Payer: Self-pay | Admitting: Obstetrics and Gynecology

## 2019-05-10 NOTE — Telephone Encounter (Signed)
Medication refill request: vivelle Dot  Last AEX:  10/06/17 Next AEX:  07/03/19 Last MMG (if hormonal medication request): 09/26/18 Bi-rads 2 benign  Refill authorized: #8 with 1 RF to get her to her rescheduled appointment.

## 2019-05-30 ENCOUNTER — Other Ambulatory Visit: Payer: Self-pay | Admitting: Obstetrics and Gynecology

## 2019-07-02 NOTE — Progress Notes (Signed)
43 y.o. [id number] Married Hispanic female here for annual exam.    No hot flashes.  Mood is good.   Still with urinary incontinence with cough.   She is struggling with her weight.  She will go back to the weight loss clinic.   PCP: Hermine Messick, MD - PCP.   Patient's last menstrual period was 04/05/2012 (approximate).           Sexually active: Yes.    The current method of family planning is status post hysterectomy.    Exercising: No.  walking some Smoker:  no  Health Maintenance: Pap: 01-12-11 Neg History of abnormal Pap: Yes, years ago--no treatment--repeat pap normal MMG: 09-26-18 Diag.Bil.w/Rt.US--Benign Rt.Br.cyst/Neg/density C/BiRads2/screening 1 yr Colonoscopy:  2020 - normal.  Done due to diverticulitis. BMD:   n/a  Result  n/a TDaP: 2012 Gardasil:   no HIV:2002 Neg in Pregnancy Hep C:  Possibly had this done.  Screening Labs:  PCP.  Flu vaccine:  Recommended.    reports that she has never smoked. She has never used smokeless tobacco. She reports current alcohol use of about 2.0 standard drinks of alcohol per week. She reports that she does not use drugs.  Past Medical History:  Diagnosis Date  . Anemia    Hx blood transfusion 3-14yrs ago and iron infusion last month 03/2012  . Anxiety   . Blood transfusion without reported diagnosis 2013  . Endometriosis   . Liver disorder    fatty liver disease  . PONV (postoperative nausea and vomiting)   . Seasonal allergies   . STD (sexually transmitted disease)    Tx'd for Chlamydia at age 18  . Thyroid disease     Past Surgical History:  Procedure Laterality Date  . ABDOMINAL HYSTERECTOMY    . BREAST BIOPSY    . CYSTOSCOPY  05/03/2012   Procedure: CYSTOSCOPY;  Surgeon: Daria Pastures, MD;  Location: [facility];  Service: Gynecology;  Laterality: N/A;  . DIAGNOSTIC LAPAROSCOPY    . SALPINGOOPHORECTOMY  05/03/2012   Procedure: SALPINGO OOPHERECTOMY;  Surgeon: Daria Pastures, MD;  Location: [facility];  Service:  Gynecology;  Laterality: Bilateral;  . THYROID SURGERY     removed    Current Outpatient Medications  Medication Sig Dispense Refill  . Albuterol Sulfate 108 (90 Base) MCG/ACT AEPB Inhale into the lungs as needed.    . DULoxetine (CYMBALTA) 60 MG capsule Take by mouth daily.    Marland Kitchen estradiol (VIVELLE-DOT) 0.1 MG/24HR patch PLACE 1 PATCH (0.1 MG TOTAL) ONTO THE SKIN 2 (TWO) TIMES A WEEK. 8 patch 1  . thyroid (ARMOUR) 60 MG tablet Take 60 mg by mouth daily before breakfast. Takes Monday- Friday    . thyroid (ARMOUR) 90 MG tablet Takes Sat & Sun     No current facility-administered medications for this visit.     Family History  Problem Relation Age of Onset  . Hypertension Mother   . Kawasaki disease Brother        Dec 9 mo.  . Thyroid disease Sister   . Breast cancer Paternal Grandmother   . COPD Paternal Grandmother        dec  . Emphysema Paternal Grandmother   . Hypertension Maternal Grandmother   . Diabetes Maternal Grandmother   . Diabetes Paternal Grandfather     Review of Systems  All other systems reviewed and are negative.   Exam:   BP 128/84 (Cuff Size: Large)   Pulse 64   Resp 16  Ht 5\' 2"  (1.575 m)   Wt 206 lb 12.8 oz (93.8 kg)   LMP 04/05/2012 (Approximate)   BMI 37.82 kg/m     General appearance: alert, cooperative and appears stated age Head: normocephalic, without obvious abnormality, atraumatic Neck: no adenopathy, supple, symmetrical, trachea midline and thyroid normal to inspection and palpation Lungs: clear to auscultation bilaterally Breasts: normal appearance, no masses or tenderness, No nipple retraction or dimpling, No nipple discharge or bleeding, No axillary adenopathy Heart: regular rate and rhythm Abdomen: soft, non-tender; no masses, no organomegaly Extremities: extremities normal, atraumatic, no cyanosis or edema Skin: skin color, texture, turgor normal. No rashes or lesions Lymph nodes: cervical, supraclavicular, and axillary nodes  normal. Neurologic: grossly normal  Pelvic: External genitalia:  no lesions              No abnormal inguinal nodes palpated.              Urethra:  normal appearing urethra with no masses, tenderness or lesions              Bartholins and Skenes: normal                 Vagina: normal appearing vagina with normal color and discharge, no lesions              Cervix: absent              Pap taken: No. Bimanual Exam:  Uterus: absent              Adnexa: no mass, fullness, tenderness              Rectal exam: Yes.  .  Confirms.              Anus:  normal sphincter tone, no lesions  Chaperone was present for exam.  Assessment:   Well woman visit with normal exam. Status post TAH.  On ERT.  Fatty liver disease. GSI. On Cymbalta. Hypothyroidism.  BMI 37.8.   Plan: Mammogram screening discussed. Self breast awareness encouraged. Pap and HR HPV not indicated. Guidelines for Calcium, Vitamin D, regular exercise program including cardiovascular and weight bearing exercise. We discussed weight loss surgery.  She will pursue further information.  Refill of ERT, Vivelle Dot for one year.   I discussed potential risks of stroke, DVT, and PE. Follow up annually and prn.   After visit summary provided.

## 2019-07-03 ENCOUNTER — Other Ambulatory Visit: Payer: Self-pay

## 2019-07-03 ENCOUNTER — Ambulatory Visit: Payer: Managed Care, Other (non HMO) | Admitting: Obstetrics and Gynecology

## 2019-07-03 ENCOUNTER — Encounter: Payer: Self-pay | Admitting: Obstetrics and Gynecology

## 2019-07-03 VITALS — BP 128/84 | HR 64 | Temp 97.6°F | Resp 16 | Ht 62.0 in | Wt 206.8 lb

## 2019-07-03 DIAGNOSIS — Z01419 Encounter for gynecological examination (general) (routine) without abnormal findings: Secondary | ICD-10-CM | POA: Diagnosis not present

## 2019-07-03 MED ORDER — ESTRADIOL 0.1 MG/24HR TD PTTW: 1.0000 | MEDICATED_PATCH | TRANSDERMAL | 3 refills | Status: DC

## 2019-07-03 NOTE — Patient Instructions (Signed)

## 2019-12-27 ENCOUNTER — Telehealth: Payer: Self-pay | Admitting: Obstetrics and Gynecology

## 2019-12-27 NOTE — Telephone Encounter (Signed)
Spoke with patient. Patient states she has stopped using estradiol 0.1 mg patch due to reaction to the adhesive. She has tried x2 and has experienced the same reaction. Last used patch 3 months ago, would like to discuss alternative options.   Last AEX 07/03/19  MyChart visit scheduled for 01/07/20 at 4:30pm with Dr. Edward Jolly. Advised patient not to restart patch, medication list updated.   Routing to provider for final review. Patient is agreeable to disposition. Will close encounter.

## 2019-12-27 NOTE — Telephone Encounter (Signed)
Patient would like to speak with nurse about switching from the estradiol patch.

## 2019-12-31 ENCOUNTER — Telehealth: Payer: Managed Care, Other (non HMO) | Admitting: Family

## 2019-12-31 DIAGNOSIS — R21 Rash and other nonspecific skin eruption: Secondary | ICD-10-CM

## 2019-12-31 IMAGING — US ULTRASOUND RIGHT BREAST LIMITED
1 series · 4 of 4 positions shown · non-contrast
Comparison: Previous exam(s).

CLINICAL DATA: Possible mass felt by the patient and on recent
physical examination in the upper right breast. Right breast
fibroadenoma diagnosed on ultrasound guided core needle biopsy on
01/02/2017.

EXAM:
DIGITAL DIAGNOSTIC BILATERAL MAMMOGRAM WITH CAD AND TOMO
ULTRASOUND RIGHT BREAST

[Series 1: ultrasound right breast limited · 0.07mm/px · 4 of 4 slices shown]
[im 1/4]
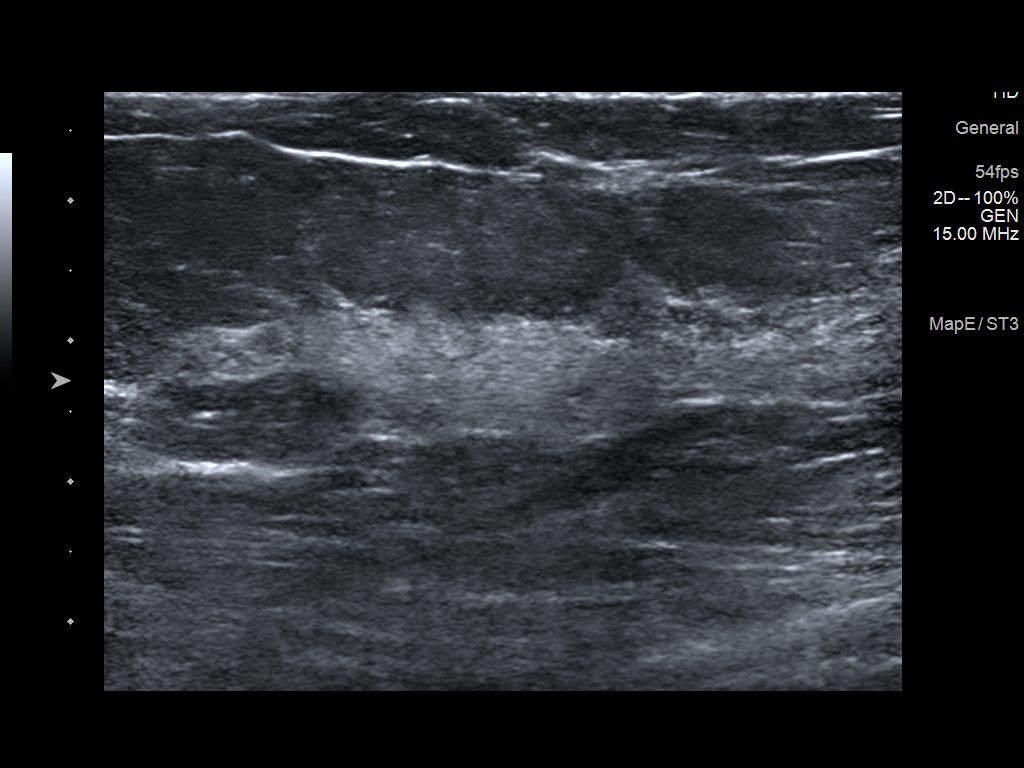
[im 2/4]
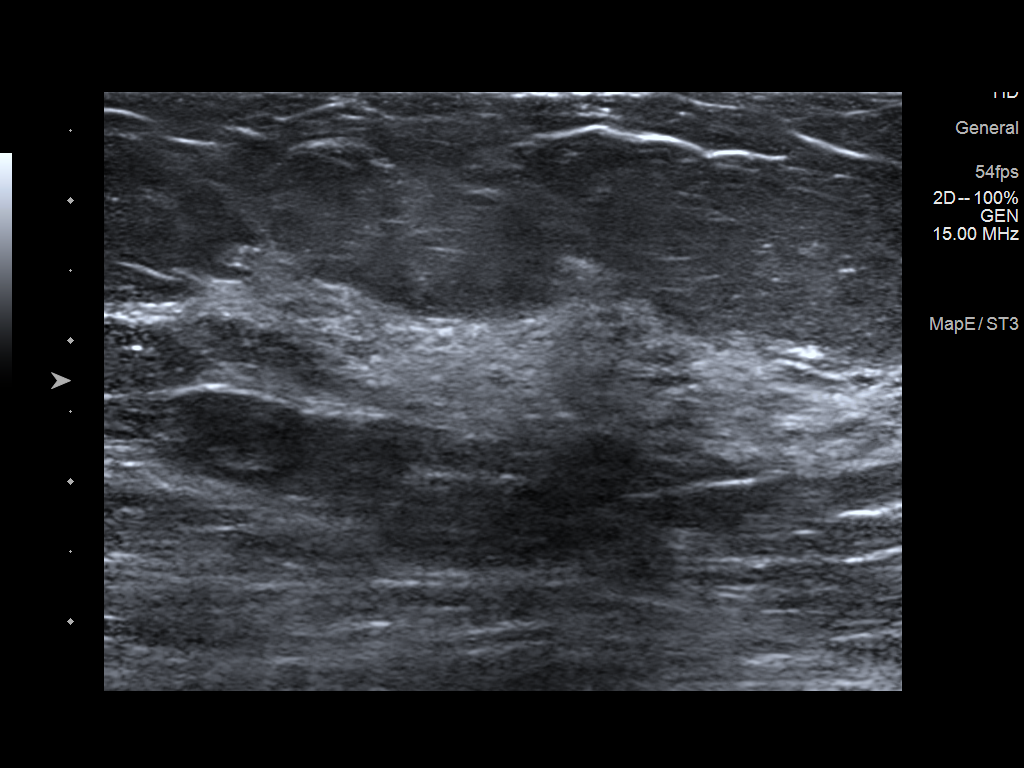
[im 3/4]
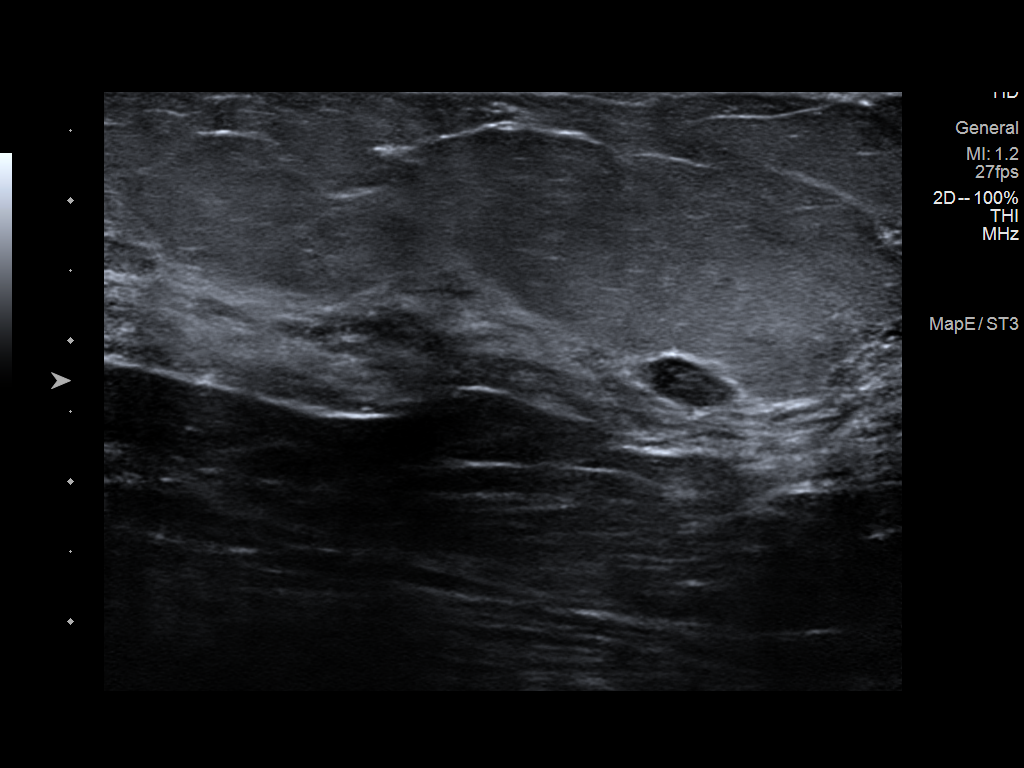
[im 4/4]
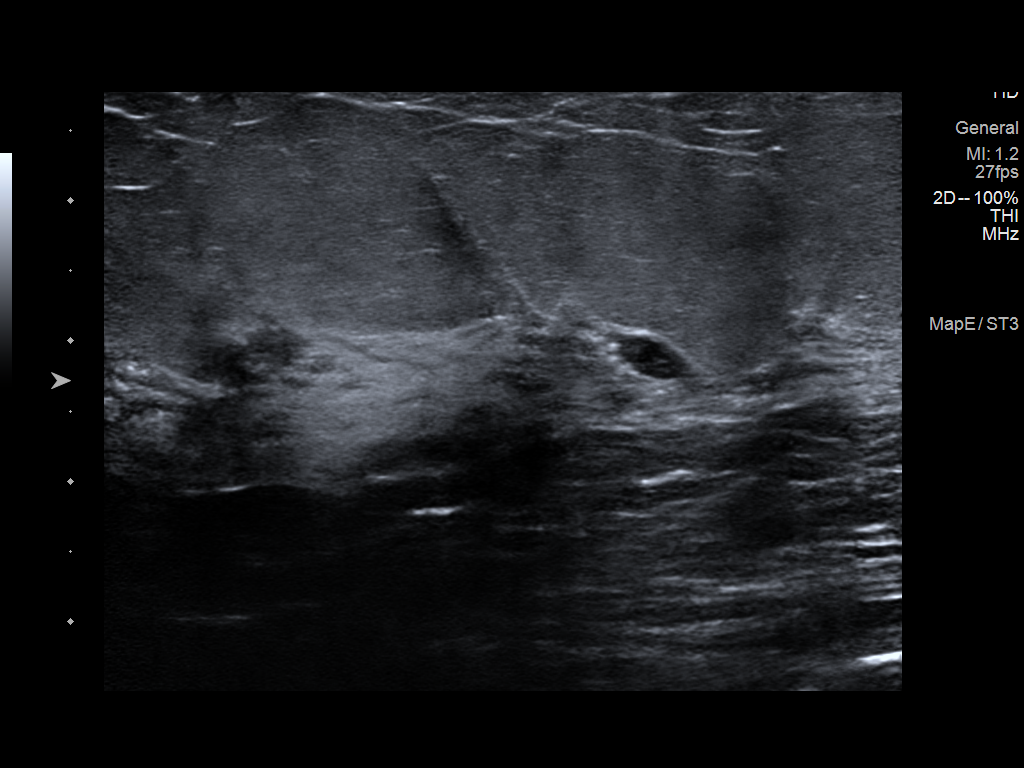

[4 of 4 positions shown; findings below may reference images not displayed]

ACR Breast Density Category c: The breast tissue is heterogeneously
dense, which may obscure small masses.
FINDINGS: 3D tomographic and 2D generated mammographic views of both breasts
demonstrate multiple small, oval and rounded, circumscribed masses
in both breasts without significant change compared to previous
examinations. These include a small, oval circumscribed mass in the
region of the possible mass felt by the patient, marked with a
metallic marker. No interval findings suspicious for malignancy in
either breast.

Mammographic images were processed with CAD.

On physical exam, there is an approximately 4 mm rounded,
circumscribed, palpable mass at the location of the mass felt by the
patient. This is in the 12:30 o'clock position of the right breast,
3 cm from the nipple.

Targeted ultrasound is performed, showing dense glandular tissue
with focal convex anterior margins in the 12:30 o'clock position of
the right breast, 3 cm from the nipple. One of the focal convex
anterior margins corresponds to the palpable mass-like area. There
is a nearby 6 mm cyst containing low-level internal echoes,
corresponding to the circumscribed mass seen mammographically.
Mammographically, this has decreased in size since 12/30/2016.
IMPRESSION: Small, benign right breast cyst and benign fibroglandular tissue at
the location of the area of palpable concern on the right. No
evidence of malignancy in either breast.

RECOMMENDATION:
Bilateral screening mammogram in year.

I have discussed the findings and recommendations with the patient.
Results were also provided in writing at the conclusion of the
visit. If applicable, a reminder letter will be sent to the patient
regarding the next appointment.

BI-RADS CATEGORY  2: Benign.

## 2020-01-01 ENCOUNTER — Other Ambulatory Visit: Payer: Self-pay | Admitting: Family

## 2020-01-01 NOTE — Progress Notes (Signed)
Hello, Please attach a picture of your rash so it can give me more details. Can you think of any triggers that has caused this or has it becme worse. Attaching a picture and giving   more details will help me diagnose you properly. Thank you!   Jannifer Rodney, FNP    Never heard back from patient, will no charge and close visit.

## 2020-01-07 ENCOUNTER — Encounter: Payer: Self-pay | Admitting: Obstetrics and Gynecology

## 2020-01-07 ENCOUNTER — Telehealth (INDEPENDENT_AMBULATORY_CARE_PROVIDER_SITE_OTHER): Payer: Managed Care, Other (non HMO) | Admitting: Obstetrics and Gynecology

## 2020-01-07 ENCOUNTER — Other Ambulatory Visit: Payer: Self-pay

## 2020-01-07 DIAGNOSIS — Z79899 Other long term (current) drug therapy: Secondary | ICD-10-CM

## 2020-01-07 MED ORDER — ESTRADIOL ACETATE 0.1 MG/24HR VA RING: 1.0000 | VAGINAL_RING | VAGINAL | 0 refills | Status: DC

## 2020-01-07 NOTE — Progress Notes (Signed)
GYNECOLOGY  VISIT   HPI: 44 y.o.   Married  Caucasian  female   [phone] with Patient's last menstrual period was 04/05/2012 (approximate).   here for   ERT discussion.   She gives permission for the visit.  Started at 4:49, ended at 5:00.  I am at the office.  She is in her car.  She is having a reaction to the adhesive in the estrogen patch. bandaids also cause skin reactions for her.  She has used estrogen pills in the past but we switched due to her fatty liver.  She is followed by Dr. Marzetta Board, GI.   GYNECOLOGIC HISTORY: Patient's last menstrual period was 04/05/2012 (approximate). Contraception:  Hysterectomy Menopausal hormone therapy:  none Last mammogram:  09/26/18 BIRADS 2 benign Last pap smear:   01-12-11 Neg        OB History    Gravida  3   Para  3   Term  3   Preterm  0   AB  0   Living  3     SAB  0   TAB  0   Ectopic  0   Multiple  0   Live Births  3              Patient Active Problem List   Diagnosis Date Noted  . ANEMIA-IRON DEFICIENCY 09/04/2008    Past Medical History:  Diagnosis Date  . Anemia    Hx blood transfusion 3-32yrs ago and iron infusion last month 03/2012  . Anxiety   . Blood transfusion without reported diagnosis 2013  . Endometriosis   . Liver disorder    fatty liver disease  . PONV (postoperative nausea and vomiting)   . Seasonal allergies   . STD (sexually transmitted disease)    Tx'd for Chlamydia at age 24  . Thyroid disease     Past Surgical History:  Procedure Laterality Date  . ABDOMINAL HYSTERECTOMY    . BREAST BIOPSY    . CYSTOSCOPY  05/03/2012   Procedure: CYSTOSCOPY;  Surgeon: Daria Pastures, MD;  Location: [facility];  Service: Gynecology;  Laterality: N/A;  . DIAGNOSTIC LAPAROSCOPY    . SALPINGOOPHORECTOMY  05/03/2012   Procedure: SALPINGO OOPHERECTOMY;  Surgeon: Daria Pastures, MD;  Location: [facility];  Service: Gynecology;  Laterality: Bilateral;  . THYROID SURGERY     removed    Current  Outpatient Medications  Medication Sig Dispense Refill  . Albuterol Sulfate 108 (90 Base) MCG/ACT AEPB Inhale into the lungs as needed.    . DULoxetine (CYMBALTA) 60 MG capsule Take by mouth daily.    Marland Kitchen thyroid (ARMOUR) 60 MG tablet Take 60 mg by mouth daily before breakfast. Takes Monday- Friday    . thyroid (ARMOUR) 90 MG tablet Takes Sat & Sun    . Estradiol Acetate 0.1 MG/24HR RING Place 1 each vaginally every 3 (three) months. 1 each 0   No current facility-administered medications for this visit.     ALLERGIES: Topiramate and Vicodin [hydrocodone-acetaminophen]  Family History  Problem Relation Age of Onset  . Hypertension Mother   . Kawasaki disease Brother        Dec 9 mo.  . Thyroid disease Sister   . Breast cancer Paternal Grandmother   . COPD Paternal Grandmother        dec  . Emphysema Paternal Grandmother   . Hypertension Maternal Grandmother   . Diabetes Maternal Grandmother   . Diabetes Paternal Grandfather  Social History   Socioeconomic History  . Marital status: Married    Spouse name: Not on file  . Number of children: Not on file  . Years of education: Not on file  . Highest education level: Not on file  Occupational History  . Not on file  Tobacco Use  . Smoking status: Never Smoker  . Smokeless tobacco: Never Used  Substance and Sexual Activity  . Alcohol use: Yes    Alcohol/week: 2.0 standard drinks    Types: 2 Standard drinks or equivalent per week    Comment: rarely  . Drug use: No  . Sexual activity: Yes    Partners: Male    Birth control/protection: Surgical    Comment: hyst-left ovary remains  Other Topics Concern  . Not on file  Social History Narrative  . Not on file   Social Determinants of Health   Financial Resource Strain:   . Difficulty of Paying Living Expenses:   Food Insecurity:   . Worried About Programme researcher, broadcasting/film/video in the Last Year:   . Barista in the Last Year:   Transportation Needs:   . Automotive engineer (Medical):   Marland Kitchen Lack of Transportation (Non-Medical):   Physical Activity:   . Days of Exercise per Week:   . Minutes of Exercise per Session:   Stress:   . Feeling of Stress :   Social Connections:   . Frequency of Communication with Friends and Family:   . Frequency of Social Gatherings with Friends and Family:   . Attends Religious Services:   . Active Member of Clubs or Organizations:   . Attends Banker Meetings:   Marland Kitchen Marital Status:   Intimate Partner Violence:   . Fear of Current or Ex-Partner:   . Emotionally Abused:   Marland Kitchen Physically Abused:   . Sexually Abused:     Review of Systems  Constitutional: Negative.   HENT: Negative.   Eyes: Negative.   Respiratory: Negative.   Cardiovascular: Negative.   Gastrointestinal: Negative.   Endocrine: Negative.   Genitourinary: Negative.   Musculoskeletal: Negative.   Skin: Negative.   Allergic/Immunologic: Negative.   Neurological: Negative.   Hematological: Negative.   Psychiatric/Behavioral: Negative.     PHYSICAL EXAMINATION:    LMP 04/05/2012 (Approximate)     General appearance: alert, cooperative and appears stated age  ASSESSMENT  ERT. Skin reaction to transdermal estrogen.  PLAN  Stop transdermal estrogen patch.  Start Femring 0.1 mg q 90 days.  Disp:  1 ring.  RF:  None.   We discussed the benefits of Femring for the vaginal mucosa and for vaginal support.  She will update her mammogram.    An After Visit Summary was printed and given to the patient.  __11____ minutes face to face time of which over 50% was spent in counseling.

## 2020-01-17 ENCOUNTER — Other Ambulatory Visit: Payer: Self-pay | Admitting: Obstetrics and Gynecology

## 2020-01-17 DIAGNOSIS — Z1231 Encounter for screening mammogram for malignant neoplasm of breast: Secondary | ICD-10-CM

## 2020-01-24 ENCOUNTER — Ambulatory Visit: Payer: Managed Care, Other (non HMO)

## 2020-01-24 ENCOUNTER — Ambulatory Visit
Admission: RE | Admit: 2020-01-24 | Discharge: 2020-01-24 | Disposition: A | Discharge status: Home / Self Care | Payer: Managed Care, Other (non HMO) | Source: Ambulatory Visit | Attending: Obstetrics and Gynecology | Admitting: Obstetrics and Gynecology

## 2020-01-24 ENCOUNTER — Other Ambulatory Visit: Payer: Self-pay

## 2020-01-24 DIAGNOSIS — Z1231 Encounter for screening mammogram for malignant neoplasm of breast: Secondary | ICD-10-CM

## 2020-04-08 ENCOUNTER — Other Ambulatory Visit: Payer: Self-pay | Admitting: Obstetrics and Gynecology

## 2020-04-08 NOTE — Telephone Encounter (Signed)
Medication refill request: Femring Last AEX:  07/03/19 Dr. Edward Jolly Next AEX: 07/06/20 Last MMG (if hormonal medication request): 01/24/20 BIRADS 1 negative/density c Refill authorized: Today, please advise

## 2020-06-29 ENCOUNTER — Other Ambulatory Visit: Payer: Self-pay | Admitting: Obstetrics and Gynecology

## 2020-06-29 NOTE — Telephone Encounter (Signed)
Medication refill request: Femring Last AEX:  07/03/19 Dr. Edward Jolly Next AEX: 07/06/20 Last MMG (if hormonal medication request): 01/24/20 BIRADS 1 negative/density c Refill authorized: Today, please advise

## 2020-07-06 ENCOUNTER — Ambulatory Visit: Payer: Managed Care, Other (non HMO) | Admitting: Obstetrics and Gynecology

## 2020-11-18 ENCOUNTER — Ambulatory Visit: Payer: Managed Care, Other (non HMO) | Admitting: Obstetrics and Gynecology

## 2021-01-19 NOTE — Progress Notes (Signed)
45 y.o. G55P3003 Married Hispanic female here for annual exam.    Stopped using estrogen ring due to cost.  Has not used in 6 months.  Some hot flashes.  Transdermal estrogen caused rash.   Some mood swings.  Wonders if she has ADD.   Did a sleep study.  CPAP recommended, and patient has not tried this yet.   Losing weight.   Slow voiding.  No significant leakage with cough, laugh, or sneeze.   PCP:  Harvie Heck, MD   Patient's last menstrual period was 04/05/2012 (approximate).           Sexually active: Yes.    The current method of family planning is status post hysterectomy.    Exercising: Yes.    walking and home work outs Smoker:  no  Health Maintenance: Pap: 01-12-11 Neg History of abnormal Pap:  Yes, years ago--no treatment--repeat pap normal MMG: 01-24-20 3D/Neg/Birads1 Colonoscopy:  2020 normal;done due to diverticulitis BMD:   n/a  Result  n/a TDaP: 2022 Gardasil:   no HIV:Neg in preg Hep C:Unsure Screening Labs:  PCP   reports that she has never smoked. She has never used smokeless tobacco. She reports current alcohol use. She reports that she does not use drugs.  Past Medical History:  Diagnosis Date  . Anemia    Hx blood transfusion 3-35yrs ago and iron infusion last month 03/2012  . Anxiety   . Blood transfusion without reported diagnosis 2013  . Endometriosis   . Liver disorder    fatty liver disease  . PONV (postoperative nausea and vomiting)   . Seasonal allergies   . STD (sexually transmitted disease)    Tx'd for Chlamydia at age 56  . Thyroid disease     Past Surgical History:  Procedure Laterality Date  . ABDOMINAL HYSTERECTOMY    . BREAST BIOPSY    . CYSTOSCOPY  05/03/2012   Procedure: CYSTOSCOPY;  Surgeon: Loney Laurence, MD;  Location: [facility];  Service: Gynecology;  Laterality: N/A;  . DIAGNOSTIC LAPAROSCOPY    . SALPINGOOPHORECTOMY  05/03/2012   Procedure: SALPINGO OOPHERECTOMY;  Surgeon: Loney Laurence, MD;  Location: [facility];  Service: Gynecology;  Laterality: Bilateral;  . THYROID SURGERY     removed    Current Outpatient Medications  Medication Sig Dispense Refill  . Albuterol Sulfate 108 (90 Base) MCG/ACT AEPB Inhale into the lungs as needed.    . BD PEN NEEDLE NANO 2ND GEN 32G X 4 MM MISC daily. as directed    . DULoxetine (CYMBALTA) 60 MG capsule Take by mouth daily.    Marland Kitchen ibuprofen (ADVIL) 600 MG tablet Take 600 mg by mouth every 6 (six) hours as needed.    . Insulin Pen Needle (BD PEN NEEDLE NANO 2ND GEN) 32G X 4 MM MISC daily.    . Liraglutide -Weight Management (SAXENDA) 18 MG/3ML SOPN Inject into the skin.    . naproxen (NAPROSYN) 500 MG tablet Take 500 mg by mouth 2 (two) times daily.    Marland Kitchen omeprazole (PRILOSEC) 40 MG capsule Take by mouth.    . phentermine (ADIPEX-P) 37.5 MG tablet Take by mouth.    . thyroid (ARMOUR) 60 MG tablet Take 60 mg by mouth daily before breakfast. Takes Monday- Friday    . thyroid (ARMOUR) 90 MG tablet Takes Sat & Sun     No current facility-administered medications for this visit.    Family History  Problem Relation Age of Onset  . Hypertension Mother   .  Kawasaki disease Brother        Dec 9 mo.  . Thyroid disease Sister   . Breast cancer Paternal Grandmother   . COPD Paternal Grandmother        dec  . Emphysema Paternal Grandmother   . Hypertension Maternal Grandmother   . Diabetes Maternal Grandmother   . Diabetes Paternal Grandfather     Review of Systems  All other systems reviewed and are negative.   Exam:   BP 122/78   Pulse 72   Ht 5' 2.5" (1.588 m)   Wt 188 lb (85.3 kg)   LMP 04/05/2012 (Approximate)   SpO2 100%   BMI 33.84 kg/m     General appearance: alert, cooperative and appears stated age Head: normocephalic, without obvious abnormality, atraumatic Neck: no adenopathy, supple, symmetrical, trachea midline and thyroid normal to inspection and palpation Lungs: clear to auscultation bilaterally Breasts: normal appearance, no masses  or tenderness, No nipple retraction or dimpling, No nipple discharge or bleeding, No axillary adenopathy Heart: regular rate and rhythm Abdomen: soft, non-tender; no masses, no organomegaly Extremities: extremities normal, atraumatic, no cyanosis or edema Skin: skin color, texture, turgor normal. No rashes or lesions Lymph nodes: cervical, supraclavicular, and axillary nodes normal. Neurologic: grossly normal  Pelvic: External genitalia:  no lesions              No abnormal inguinal nodes palpated.              Urethra:  normal appearing urethra with no masses, tenderness or lesions              Bartholins and Skenes: normal                 Vagina: normal appearing vagina with normal color and discharge, no lesions              Cervix: absent              Pap taken: No. Bimanual Exam:  Uterus:  absent              Adnexa: no mass, fullness, tenderness              Rectal exam: Yes.  .  Confirms.              Anus:  normal sphincter tone, no lesions  Chaperone was present for exam.  Assessment:   Well woman visit with normal exam. Status post TAH.  Off ERT.  Fatty liver disease. Voiding hesitancy.  On Cymbalta. Hypothyroidism.   Plan: Mammogram screening discussed.  She will update this.  Self breast awareness reviewed. Pap and HR HPV as above. Guidelines for Calcium, Vitamin D, regular exercise program including cardiovascular and weight bearing exercise. Will check FSH and estradiol.  Rx for Estradiol 0.5 mg daily.  I discussed WHI and increased risk of stroke, DVT, and PE. I discussed the effect of Cymbalta on urinary pattern.  Follow up annually and prn.

## 2021-01-21 ENCOUNTER — Other Ambulatory Visit: Payer: Self-pay

## 2021-01-21 ENCOUNTER — Encounter: Payer: Self-pay | Admitting: Obstetrics and Gynecology

## 2021-01-21 ENCOUNTER — Ambulatory Visit (INDEPENDENT_AMBULATORY_CARE_PROVIDER_SITE_OTHER): Payer: Managed Care, Other (non HMO) | Admitting: Obstetrics and Gynecology

## 2021-01-21 VITALS — BP 122/78 | HR 72 | Ht 62.5 in | Wt 188.0 lb

## 2021-01-21 DIAGNOSIS — Z01419 Encounter for gynecological examination (general) (routine) without abnormal findings: Secondary | ICD-10-CM

## 2021-01-21 DIAGNOSIS — N951 Menopausal and female climacteric states: Secondary | ICD-10-CM | POA: Diagnosis not present

## 2021-01-21 LAB — ESTRADIOL: Estradiol: <15 pg/mL

## 2021-01-21 LAB — FOLLICLE STIMULATING HORMONE: FSH: 51.5 m[IU]/mL

## 2021-01-21 MED ORDER — ESTRADIOL 0.5 MG PO TABS: 0.5000 mg | ORAL_TABLET | Freq: Every day | ORAL | 3 refills | Status: DC

## 2021-01-21 NOTE — Patient Instructions (Signed)

## 2021-01-22 ENCOUNTER — Encounter: Payer: Self-pay | Admitting: Obstetrics and Gynecology

## 2021-03-05 ENCOUNTER — Encounter: Payer: Self-pay | Admitting: Obstetrics and Gynecology

## 2021-03-05 NOTE — Telephone Encounter (Signed)
Dr.Silva do you have anyone in mind that you know of?

## 2021-04-12 ENCOUNTER — Emergency Department
Admission: RE | Admit: 2021-04-12 | Discharge: 2021-04-12 | Disposition: A | Discharge status: Home / Self Care | Payer: Managed Care, Other (non HMO) | Source: Ambulatory Visit

## 2021-04-12 ENCOUNTER — Other Ambulatory Visit: Payer: Self-pay

## 2021-04-12 VITALS — BP 128/83 | HR 81 | Temp 98.3°F | Resp 18 | Ht 62.0 in | Wt 185.0 lb

## 2021-04-12 DIAGNOSIS — J029 Acute pharyngitis, unspecified: Secondary | ICD-10-CM

## 2021-04-12 DIAGNOSIS — J039 Acute tonsillitis, unspecified: Secondary | ICD-10-CM

## 2021-04-12 MED ORDER — PENICILLIN V POTASSIUM 500 MG PO TABS: 500.0000 mg | ORAL_TABLET | Freq: Two times a day (BID) | ORAL | 0 refills | Status: AC

## 2021-04-12 MED ORDER — PREDNISONE 20 MG PO TABS: ORAL_TABLET | ORAL | 0 refills | Status: DC

## 2021-04-12 NOTE — ED Provider Notes (Signed)
[name]    CSN: [id number] Arrival date & time: 04/12/21  1250      History   Chief Complaint Chief Complaint  Patient presents with   Appointment   Sore Throat    HPI Jeremy Mclamb is a 45 y.o. female.   HPI 45 year old female presents with sore throat, headache and body aches for days.  Patient reports that she feels like she is swallowing glass.  Past Medical History:  Diagnosis Date   Anemia    Hx blood transfusion 3-46yrs ago and iron infusion last month 03/2012   Anxiety    Blood transfusion without reported diagnosis 2013   Endometriosis    Liver disorder    fatty liver disease   PONV (postoperative nausea and vomiting)    Seasonal allergies    STD (sexually transmitted disease)    Tx'd for Chlamydia at age 54   Thyroid disease     Patient Active Problem List   Diagnosis Date Noted   ANEMIA-IRON DEFICIENCY 09/04/2008    Past Surgical History:  Procedure Laterality Date   ABDOMINAL HYSTERECTOMY     CYSTOSCOPY  05/03/2012   Procedure: CYSTOSCOPY;  Surgeon: Loney Laurence, MD;  Location: [facility];  Service: Gynecology;  Laterality: N/A;   DIAGNOSTIC LAPAROSCOPY     SALPINGOOPHORECTOMY  05/03/2012   Procedure: SALPINGO OOPHERECTOMY;  Surgeon: Loney Laurence, MD;  Location: [facility];  Service: Gynecology;  Laterality: Bilateral;   THYROID SURGERY     removed    OB History     Gravida  3   Para  3   Term  3   Preterm  0   AB  0   Living  3      SAB  0   IAB  0   Ectopic  0   Multiple  0   Live Births  3            Home Medications    Prior to Admission medications   Medication Sig Start Date End Date Taking? Authorizing Provider  penicillin v potassium (VEETID) 500 MG tablet Take 1 tablet (500 mg total) by mouth 2 (two) times daily for 5 days. 04/12/21 04/17/21 Yes Trevor Iha, FNP  predniSONE (DELTASONE) 20 MG tablet Take 3 tabs PO daily x 5 days. 04/12/21  Yes Trevor Iha, FNP  Albuterol Sulfate 108 (90  Base) MCG/ACT AEPB Inhale into the lungs as needed. 08/17/16   [provider]  BD PEN NEEDLE NANO 2ND GEN 32G X 4 MM MISC daily. as directed 11/06/20   [provider]  DULoxetine (CYMBALTA) 60 MG capsule Take by mouth daily. 08/18/17   [provider]  estradiol (ESTRACE) 0.5 MG tablet Take 1 tablet (0.5 mg total) by mouth daily. 01/21/21   Patton Salles, MD  ibuprofen (ADVIL) 600 MG tablet Take 600 mg by mouth every 6 (six) hours as needed. 10/30/20   [provider]  Insulin Pen Needle (BD PEN NEEDLE NANO 2ND GEN) 32G X 4 MM MISC daily. 11/06/20   [provider]  Liraglutide -Weight Management (SAXENDA) 18 MG/3ML SOPN Inject into the skin. 05/15/20   [provider]  naproxen (NAPROSYN) 500 MG tablet Take 500 mg by mouth 2 (two) times daily. 08/20/20   [provider]  omeprazole (PRILOSEC) 40 MG capsule Take by mouth. 07/06/20   [provider]  phentermine (ADIPEX-P) 37.5 MG tablet Take by mouth. 11/06/20   [provider]  thyroid (ARMOUR) 60 MG tablet Take 60 mg by mouth daily before breakfast. Takes Monday- Friday    [provider]  thyroid (ARMOUR) 90 MG tablet Takes Sat & Sun 05/25/18   [provider]    Family History Family History  Problem Relation Age of Onset   Hypertension Mother    Kawasaki disease Brother        Dec 9 mo.   Thyroid disease Sister    Breast cancer Paternal Grandmother    COPD Paternal Grandmother        dec   Emphysema Paternal Grandmother    Hypertension Maternal Grandmother    Diabetes Maternal Grandmother    Diabetes Paternal Grandfather     Social History Social History   Tobacco Use   Smoking status: Never   Smokeless tobacco: Never  Vaping Use   Vaping Use: Never used  Substance Use Topics   Alcohol use: Yes    Comment: rarely--2 drinks/month   Drug use: No     Allergies   Topiramate and Vicodin  [hydrocodone-acetaminophen]   Review of Systems Review of Systems  Constitutional:  Positive for fever.  HENT:  Positive for sore throat, trouble swallowing and voice change.   Respiratory:  Positive for cough.   All other systems reviewed and are negative.   Physical Exam Triage Vital Signs ED Triage Vitals  Enc Vitals Group     BP 04/12/21 1317 128/83     Pulse Rate 04/12/21 1317 81     Resp 04/12/21 1317 18     Temp 04/12/21 1317 98.3 F (36.8 C)     Temp Source 04/12/21 1317 Oral     SpO2 04/12/21 1317 98 %     Weight 04/12/21 1313 185 lb (83.9 kg)     Height 04/12/21 1313 5\' 2"  (1.575 m)     Head Circumference --      Peak Flow --      Pain Score 04/12/21 1313 6     Pain Loc --      Pain Edu? --      Excl. in GC? --    No data found.  Updated Vital Signs BP 128/83 (BP Location: Right Arm)   Pulse 81   Temp 98.3 F (36.8 C) (Oral)   Resp 18   Ht 5\' 2"  (1.575 m)   Wt 185 lb (83.9 kg)   LMP 04/05/2012 (Approximate)   SpO2 98%   BMI 33.84 kg/m      Physical Exam Vitals and nursing note reviewed.  Constitutional:      General: She is not in acute distress.    Appearance: Normal appearance. She is normal weight. She is ill-appearing.  HENT:     Head: Normocephalic and atraumatic.     Right Ear: Tympanic membrane, ear canal and external ear normal.     Left Ear: Tympanic membrane, ear canal and external ear normal.     Mouth/Throat:     Lips: Pink.     Mouth: Mucous membranes are moist.     Pharynx: Pharyngeal swelling, posterior oropharyngeal erythema and uvula swelling present.     Tonsils: Tonsillar exudate present. 3+ on the right. 2+ on the left.  Eyes:     Extraocular Movements: Extraocular movements intact.     Conjunctiva/sclera: Conjunctivae normal.     Pupils: Pupils are equal, round, and reactive to light.  Cardiovascular:     Rate and Rhythm: Normal rate and regular rhythm.  Pulses: Normal pulses.     Heart sounds: Normal heart sounds.   Pulmonary:     Effort: Pulmonary effort is normal.     Breath sounds: Normal breath sounds. No wheezing, rhonchi or rales.  Musculoskeletal:        General: Normal range of motion.     Cervical back: Normal range of motion. Tenderness present.  Lymphadenopathy:     Cervical: Cervical adenopathy present.  Skin:    General: Skin is warm and dry.  Neurological:     General: No focal deficit present.     Mental Status: She is alert and oriented to person, place, and time. Mental status is at baseline.  Psychiatric:        Mood and Affect: Mood normal.        Behavior: Behavior normal.        Thought Content: Thought content normal.     UC Treatments / Results  Labs (all labs ordered are listed, but only abnormal results are displayed) Labs Reviewed - No data to display  EKG   Radiology No results found.  Procedures Procedures (including critical care time)  Medications Ordered in UC Medications - No data to display  Initial Impression / Assessment and Plan / UC Course  I have reviewed the triage vital signs and the nursing notes.  Pertinent labs & imaging results that were available during my care of the patient were reviewed by me and considered in my medical decision making (see chart for details).     MDM: 1.  Acute pharyngitis-Rx'd Pen-Vee K; 2.  Tonsillitis-Rx'd Pen-Vee K and Prednisone burst.  Urged home, hemodynamically stable. Advised patient to take medications as directed with food to completion.  Encouraged patient increase daily water intake while taking these medications. Final Clinical Impressions(s) / UC Diagnoses   Final diagnoses:  Acute pharyngitis, unspecified etiology  Acute tonsillitis, unspecified etiology     Discharge Instructions      Advised patient to take medications as directed with food to completion.  Encouraged patient increase daily water intake while taking these medications.     ED Prescriptions     Medication Sig  Dispense Auth. Provider   predniSONE (DELTASONE) 20 MG tablet Take 3 tabs PO daily x 5 days. 15 tablet Trevor Iha, FNP   penicillin v potassium (VEETID) 500 MG tablet Take 1 tablet (500 mg total) by mouth 2 (two) times daily for 5 days. 10 tablet Trevor Iha, FNP      PDMP not reviewed this encounter.   Trevor Iha, FNP 04/12/21 1407

## 2021-04-12 NOTE — ED Triage Notes (Signed)
Pt st she started having a sore throat  and headache and body aches. "I feel like Im swallowing glass".  Since Friday. Pt has taken dayquill and nightquill

## 2021-04-12 NOTE — Discharge Instructions (Addendum)
Advised patient to take medications as directed with food to completion.  Encouraged patient increase daily water intake while taking these medications. 

## 2021-04-29 IMAGING — MG DIGITAL SCREENING BILAT W/ TOMO W/ CAD
6 of 10 series · 6 of 30 positions shown · non-contrast
Comparison: Previous exam(s).

CLINICAL DATA: Screening.

EXAM:
DIGITAL SCREENING BILATERAL MAMMOGRAM WITH TOMO AND CAD

[R CC synth-2D]
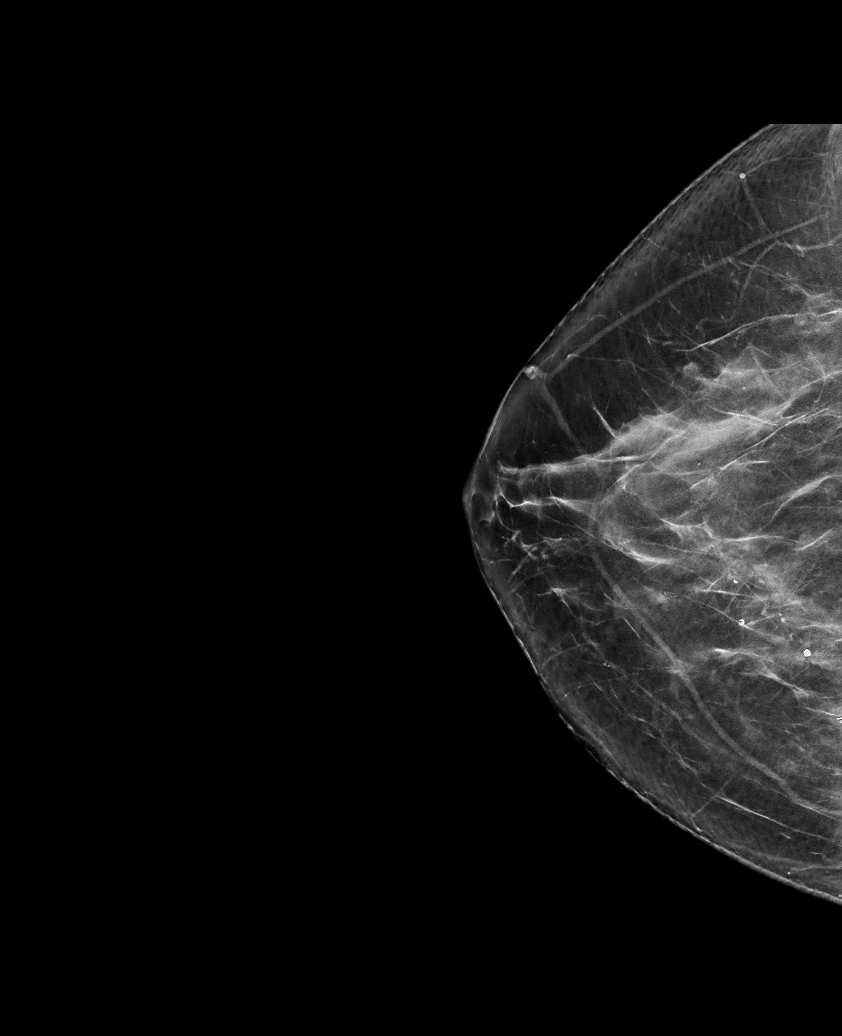

[R MLO synth-2D (1 of 2)]
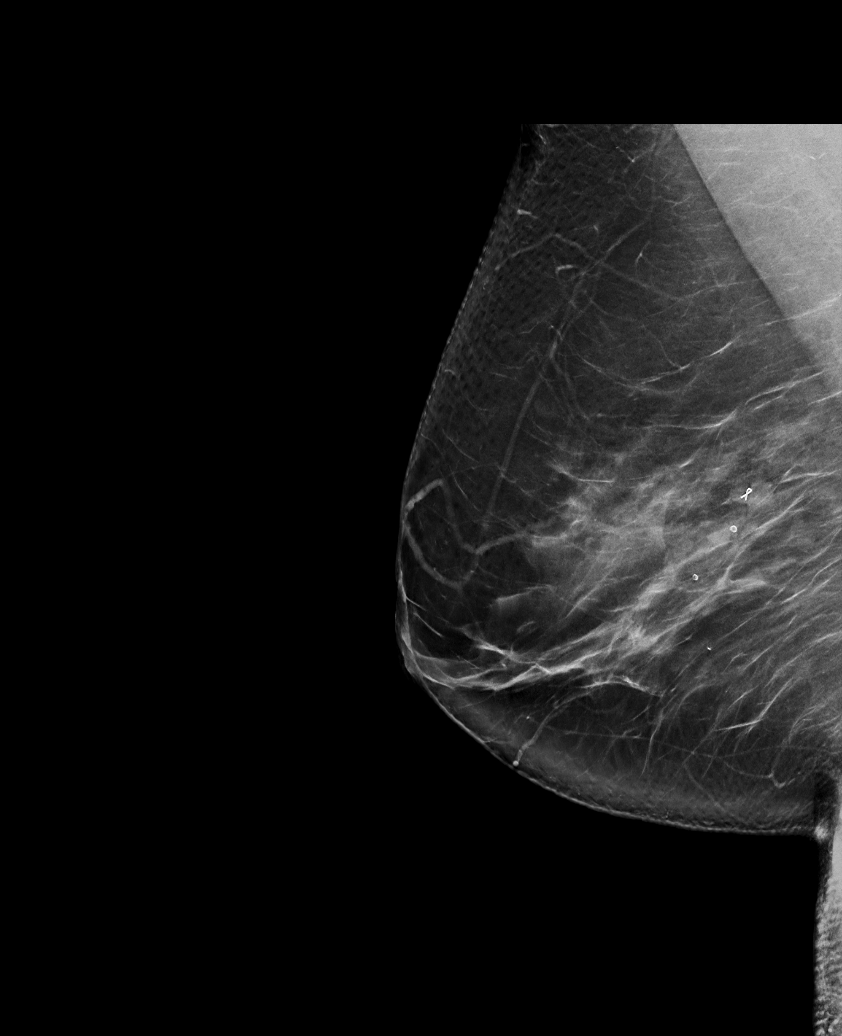

[L CC synth-2D]
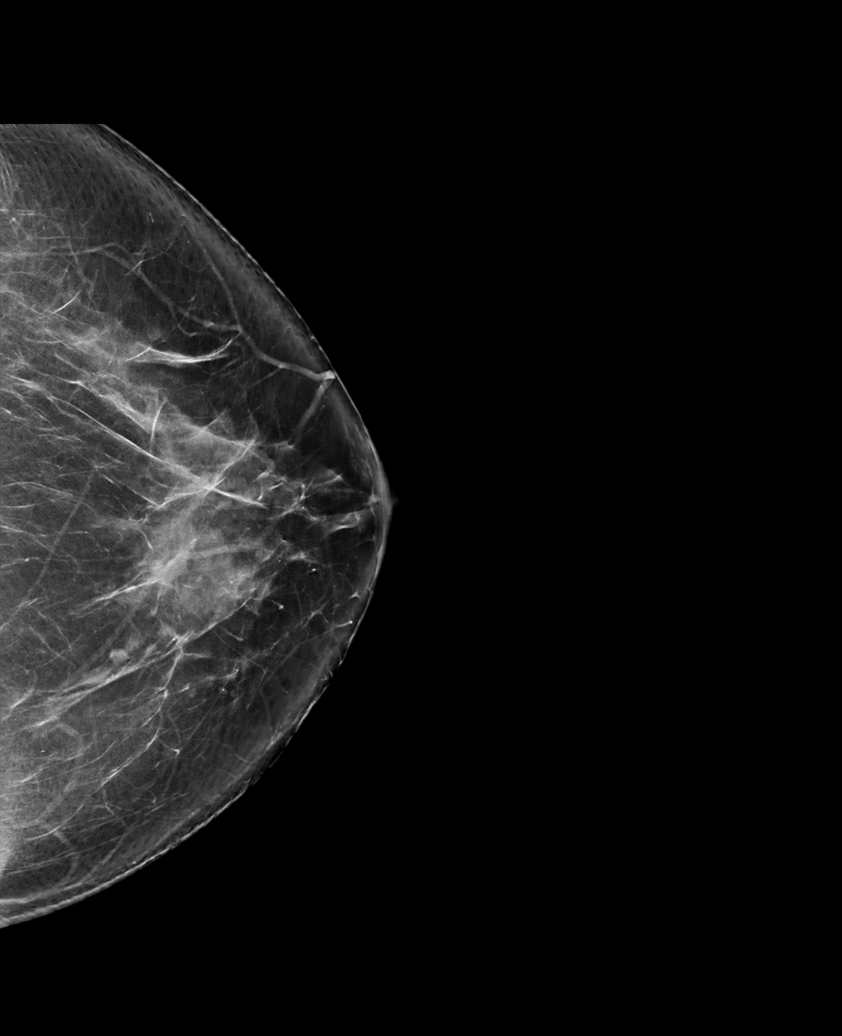

[L MLO synth-2D]
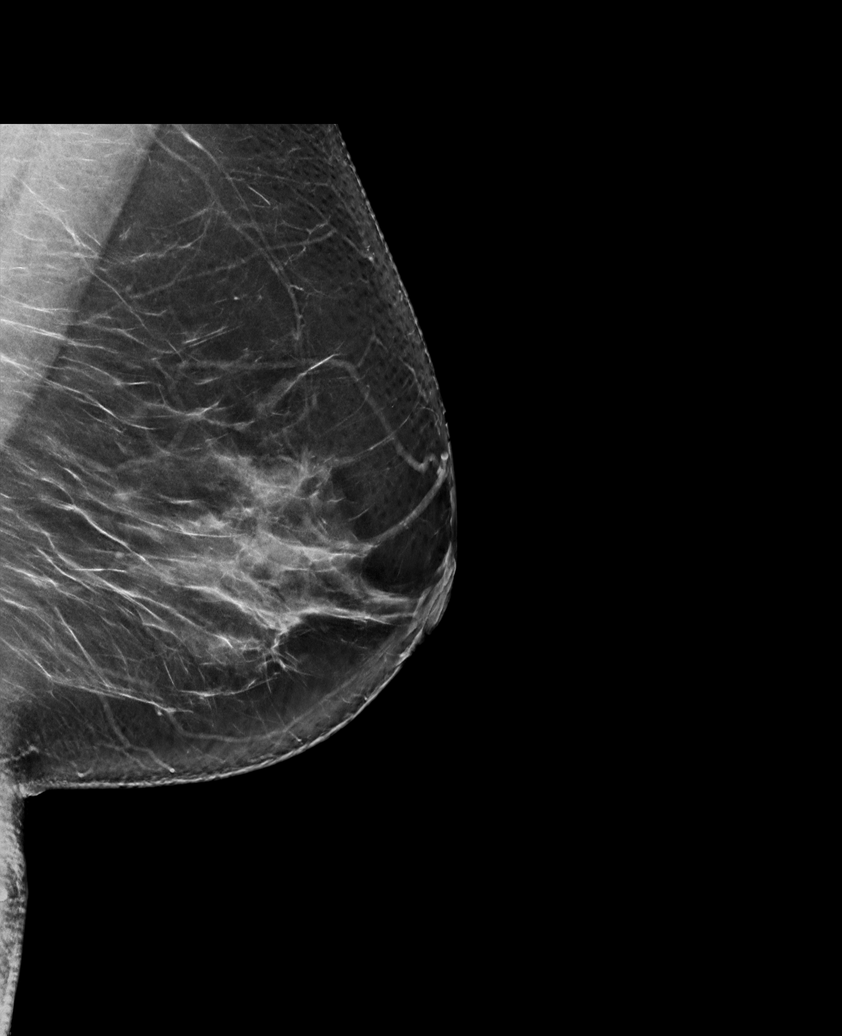

[R MLO synth-2D (2 of 2)]
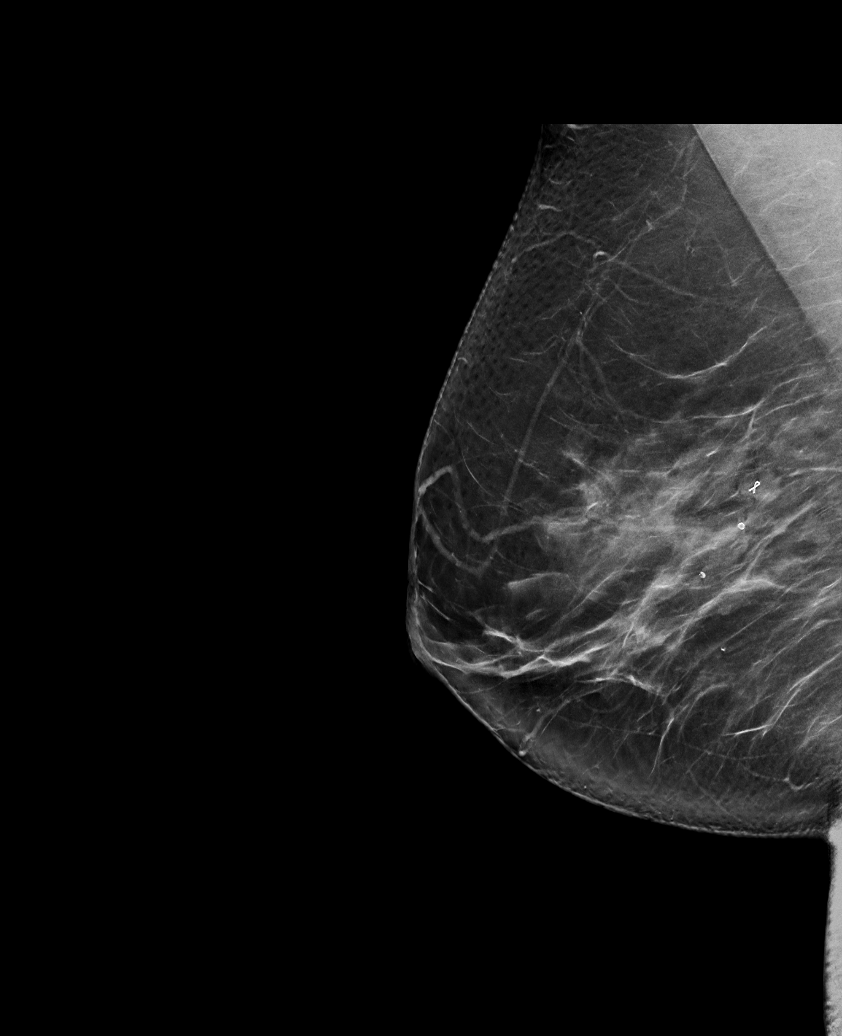

[L MLO tomo · tomo slice 45/88.0]
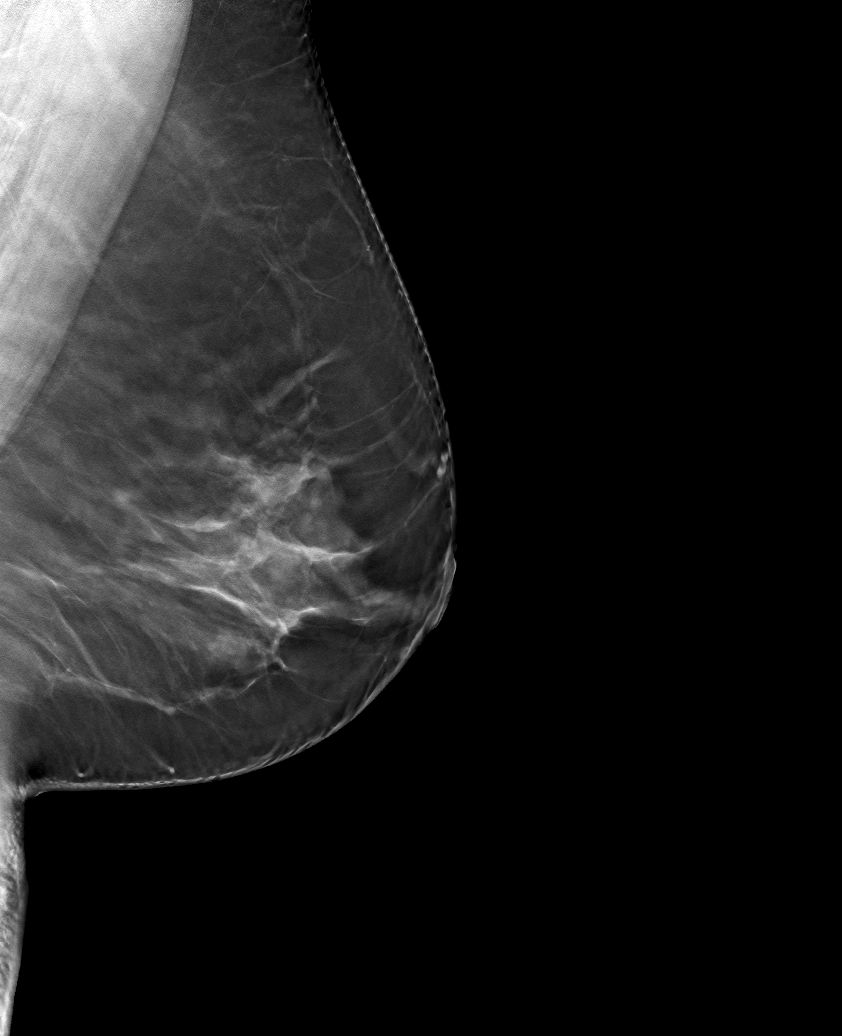

[6 of 30 positions shown; findings below may reference images not displayed]

ACR Breast Density Category c: The breast tissue is heterogeneously
dense, which may obscure small masses.
FINDINGS: There are no findings suspicious for malignancy. Images were
processed with CAD.
IMPRESSION: No mammographic evidence of malignancy. A result letter of this
screening mammogram will be mailed directly to the patient.

RECOMMENDATION:
Screening mammogram in one year. (Code:FT-U-LHB)

BI-RADS CATEGORY  1: Negative.

## 2021-07-06 ENCOUNTER — Other Ambulatory Visit (HOSPITAL_BASED_OUTPATIENT_CLINIC_OR_DEPARTMENT_OTHER): Payer: Self-pay | Admitting: Obstetrics and Gynecology

## 2021-07-06 DIAGNOSIS — Z1231 Encounter for screening mammogram for malignant neoplasm of breast: Secondary | ICD-10-CM

## 2021-07-08 ENCOUNTER — Other Ambulatory Visit: Payer: Self-pay

## 2021-07-08 ENCOUNTER — Ambulatory Visit (INDEPENDENT_AMBULATORY_CARE_PROVIDER_SITE_OTHER): Payer: Managed Care, Other (non HMO)

## 2021-07-08 DIAGNOSIS — Z1231 Encounter for screening mammogram for malignant neoplasm of breast: Secondary | ICD-10-CM

## 2022-01-25 ENCOUNTER — Telehealth: Payer: Self-pay | Admitting: *Deleted

## 2022-01-25 MED ORDER — ESTRADIOL 0.5 MG PO TABS: 0.5000 mg | ORAL_TABLET | Freq: Every day | ORAL | 0 refills | Status: DC

## 2022-01-25 NOTE — Telephone Encounter (Signed)
Patient left voicemail on triage line requesting refill of estradiol. Pharmacy confirmed in voicemail as [facility] in [place] on [address].   Medication refill request: Estradiol  Last AEX:  01-21-21 BS Next AEX: 04-06-22  Last MMG (if hormonal medication request): 07-08-21 density C/BIRADS 1 negative  Refill authorized: Today, please advise.   Medication pended for #90, 0RF. Please refill if appropriate.

## 2022-01-25 NOTE — Telephone Encounter (Signed)
I refilled her estrogen for 90 days.   Encounter closed.

## 2022-01-27 ENCOUNTER — Ambulatory Visit: Payer: Managed Care, Other (non HMO) | Admitting: Obstetrics and Gynecology

## 2022-04-06 ENCOUNTER — Ambulatory Visit: Payer: Managed Care, Other (non HMO) | Admitting: Obstetrics and Gynecology

## 2022-05-05 NOTE — Progress Notes (Unsigned)
46 y.o. G30P3003 Married Hispanic female here for annual exam.    Has low energy.   Taking estradiol pil.   Started Cymbalta to treat joint pain.   Thinks she has not dealt with the death of her father yet.   Patient is in counseling.  Will be doing an evaluation for ADHD.   PCP:   Harvie Heck, Md  Patient's last menstrual period was 04/05/2012 (approximate).           Sexually active: Yes.    The current method of family planning is status post hysterectomy.    Exercising: Yes.     Cardio and weights Smoker:  no  Health Maintenance: Pap:  01-12-11 Neg History of abnormal Pap:  Years ago. No treatment;repeat pap normal MMG:  07-08-21 Neg/Birads1 Colonoscopy:  2020 normal;done due to diverticulitis BMD:   n/a  Result  n/a TDaP:  2022 Gardasil:   no HIV: Neg in past Hep C: Unsure Screening Labs:  PCP   reports that she has never smoked. She has never used smokeless tobacco. She reports current alcohol use. She reports that she does not use drugs.  Past Medical History:  Diagnosis Date   Anemia    Hx blood transfusion 3-39yrs ago and iron infusion last month 03/2012   Anxiety    Blood transfusion without reported diagnosis 2013   Endometriosis    Liver disorder    fatty liver disease   PONV (postoperative nausea and vomiting)    Seasonal allergies    STD (sexually transmitted disease)    Tx'd for Chlamydia at age 41   Thyroid disease     Past Surgical History:  Procedure Laterality Date   ABDOMINAL HYSTERECTOMY     CYSTOSCOPY  05/03/2012   Procedure: CYSTOSCOPY;  Surgeon: Loney Laurence, MD;  Location: [facility];  Service: Gynecology;  Laterality: N/A;   DIAGNOSTIC LAPAROSCOPY     SALPINGOOPHORECTOMY  05/03/2012   Procedure: SALPINGO OOPHERECTOMY;  Surgeon: Loney Laurence, MD;  Location: [facility];  Service: Gynecology;  Laterality: Bilateral;   THYROID SURGERY     removed    Current Outpatient Medications  Medication Sig Dispense Refill   DULoxetine  (CYMBALTA) 60 MG capsule Take by mouth.     Albuterol Sulfate 108 (90 Base) MCG/ACT AEPB Inhale into the lungs as needed.     estradiol (ESTRACE) 0.5 MG tablet Take 1 tablet (0.5 mg total) by mouth daily. 90 tablet 0   ibuprofen (ADVIL) 600 MG tablet Take 600 mg by mouth every 6 (six) hours as needed.     naproxen (NAPROSYN) 500 MG tablet Take 500 mg by mouth 2 (two) times daily.     thyroid (ARMOUR) 60 MG tablet Take 60 mg by mouth daily before breakfast. Takes Monday- Friday     thyroid (ARMOUR) 90 MG tablet Takes Sat & Sun     No current facility-administered medications for this visit.    Family History  Problem Relation Age of Onset   Hypertension Mother    Kawasaki disease Brother        Dec 9 mo.   Thyroid disease Sister    Breast cancer Paternal Grandmother    COPD Paternal Grandmother        dec   Emphysema Paternal Grandmother    Hypertension Maternal Grandmother    Diabetes Maternal Grandmother    Diabetes Paternal Grandfather     Review of Systems  All other systems reviewed and are negative.   Exam:  BP 114/68   Pulse 67   Ht 5\' 2"  (1.575 m)   Wt 212 lb (96.2 kg)   LMP 04/05/2012 (Approximate)   SpO2 97%   BMI 38.78 kg/m     General appearance: alert, cooperative and appears stated age Head: normocephalic, without obvious abnormality, atraumatic Neck: no adenopathy, supple, symmetrical, trachea midline and thyroid normal to inspection and palpation Lungs: clear to auscultation bilaterally Breasts: normal appearance, no masses or tenderness, No nipple retraction or dimpling, No nipple discharge or bleeding, No axillary adenopathy Heart: regular rate and rhythm Abdomen: soft, non-tender; no masses, no organomegaly Extremities: extremities normal, atraumatic, no cyanosis or edema Skin: skin color, texture, turgor normal. No rashes or lesions Lymph nodes: cervical, supraclavicular, and axillary nodes normal. Neurologic: grossly normal  Pelvic: External  genitalia:  no lesions              No abnormal inguinal nodes palpated.              Urethra:  normal appearing urethra with no masses, tenderness or lesions              Bartholins and Skenes: normal                 Vagina: normal appearing vagina with normal color and discharge, no lesions              Cervix: no lesions              Pap taken: {yes no:314532} Bimanual Exam:  Uterus:  normal size, contour, position, consistency, mobility, non-tender              Adnexa: no mass, fullness, tenderness              Rectal exam: {yes no:314532}.  Confirms.              Anus:  normal sphincter tone, no lesions  Chaperone was present for exam:  ***  Assessment:   Well woman visit with gynecologic exam. Status post TAH.  ERT.  Fatty liver disease. Hx voiding hesitancy.  On Cymbalta. Hypothyroidism.   Plan: Mammogram screening discussed. Self breast awareness reviewed. Pap and HR HPV as above. Guidelines for Calcium, Vitamin D, regular exercise program including cardiovascular and weight bearing exercise.   Follow up annually and prn.   Additional counseling given.  {yes 06/05/2012. _______ minutes face to face time of which over 50% was spent in counseling.    After visit summary provided.

## 2022-05-07 ENCOUNTER — Other Ambulatory Visit: Payer: Self-pay | Admitting: Obstetrics and Gynecology

## 2022-05-10 ENCOUNTER — Encounter: Payer: Self-pay | Admitting: Obstetrics and Gynecology

## 2022-05-10 ENCOUNTER — Ambulatory Visit (INDEPENDENT_AMBULATORY_CARE_PROVIDER_SITE_OTHER): Payer: Managed Care, Other (non HMO) | Admitting: Obstetrics and Gynecology

## 2022-05-10 VITALS — BP 114/68 | HR 67 | Ht 62.0 in | Wt 212.0 lb

## 2022-05-10 DIAGNOSIS — Z01419 Encounter for gynecological examination (general) (routine) without abnormal findings: Secondary | ICD-10-CM

## 2022-05-10 MED ORDER — ESTRADIOL 0.5 MG PO TABS: 0.5000 mg | ORAL_TABLET | Freq: Every day | ORAL | 3 refills | Status: DC

## 2022-05-10 NOTE — Patient Instructions (Addendum)
Consider counseling through [facility] for bereavement.   Ask your provider if adding Wellbutrin to your Cymbalta is appropriate for you.  [facility] may be an option for you for dermatology.   EXERCISE AND DIET:  We recommended that you start or continue a regular exercise program for good health. Regular exercise means any activity that makes your heart beat faster and makes you sweat.  We recommend exercising at least 30 minutes per day at least 3 days a week, preferably 4 or 5.  We also recommend a diet low in fat and sugar.  Inactivity, poor dietary choices and obesity can cause diabetes, heart attack, stroke, and kidney damage, among others.    ALCOHOL AND SMOKING:  Women should limit their alcohol intake to no more than 7 drinks/beers/glasses of wine (combined, not each!) per week. Moderation of alcohol intake to this level decreases your risk of breast cancer and liver damage. And of course, no recreational drugs are part of a healthy lifestyle.  And absolutely no smoking or even second hand smoke. Most people know smoking can cause heart and lung diseases, but did you know it also contributes to weakening of your bones? Aging of your skin?  Yellowing of your teeth and nails?  CALCIUM AND VITAMIN D:  Adequate intake of calcium and Vitamin D are recommended.  The recommendations for exact amounts of these supplements seem to change often, but generally speaking 600 mg of calcium (either carbonate or citrate) and 800 units of Vitamin D per day seems prudent. Certain women may benefit from higher intake of Vitamin D.  If you are among these women, your doctor will have told you during your visit.    PAP SMEARS:  Pap smears, to check for cervical cancer or precancers,  have traditionally been done yearly, although recent scientific advances have shown that most women can have pap smears less often.  However, every woman still should have a physical exam from her gynecologist every 47 years old.  It will include a breast check, inspection of the vulva and vagina to check for abnormal growths or skin changes, a visual exam of the cervix, and then an exam to evaluate the size and shape of the uterus and ovaries.  And after 47 years of age, a rectal exam is indicated to check for rectal cancers. We will also provide age appropriate advice regarding health maintenance, like when you should have certain vaccines, screening for sexually transmitted diseases, bone density testing, colonoscopy, mammograms, etc.   MAMMOGRAMS:  All women over 47 years old should have a yearly mammogram. Many facilities now offer a "3D" mammogram, which may cost around $50 extra out of pocket. If possible,  we recommend you accept the option to have the 3D mammogram performed.  It both reduces the number of women who will be called back for extra views which then turn out to be normal, and it is better than the routine mammogram at detecting truly abnormal areas.    COLONOSCOPY:  Colonoscopy to screen for colon cancer is recommended for all women at age 47.  We know, you hate the idea of the prep.  We agree, BUT, having colon cancer and not knowing it is worse!!  Colon cancer so often starts as a polyp that can be seen and removed at colonscopy, which can quite literally save your life!  And if your first colonoscopy is normal and you have no family history of colon cancer, most women don't have to have it again for  10 years.  Once every ten years, you can do something that may end up saving your life, right?  We will be happy to help you get it scheduled when you are ready.  Be sure to check your insurance coverage so you understand how much it will cost.  It may be covered as a preventative service at no cost, but you should check your particular policy.

## 2022-05-11 NOTE — Telephone Encounter (Signed)
Last annual exam was 01/2021 Annual exam 05/10/22 Mammogram 11/22

## 2022-12-27 ENCOUNTER — Other Ambulatory Visit: Payer: Self-pay

## 2022-12-27 DIAGNOSIS — Z0000 Encounter for general adult medical examination without abnormal findings: Secondary | ICD-10-CM

## 2023-01-06 ENCOUNTER — Ambulatory Visit: Payer: Managed Care, Other (non HMO)

## 2023-02-03 ENCOUNTER — Ambulatory Visit: Payer: Managed Care, Other (non HMO)

## 2023-05-22 ENCOUNTER — Other Ambulatory Visit: Payer: Self-pay

## 2023-05-22 MED ORDER — ESTRADIOL 0.5 MG PO TABS: 0.5000 mg | ORAL_TABLET | Freq: Every day | ORAL | 0 refills | Status: DC

## 2023-05-22 NOTE — Telephone Encounter (Signed)
Med refill request: estrace Last AEX: 05/10/22 Next AEX: 08/07/23 Last MMG (if hormonal me 04/11/23 ([facility]) Refill authorized: Please Advise, #90, 0 RF

## 2023-07-26 NOTE — Progress Notes (Unsigned)
47 y.o. G54P3003 Married Caucasian female here for annual exam.    PCP: Harvie Heck, MD   Patient's last menstrual period was 04/05/2012 (approximate).           Sexually active: Yes.    The current method of family planning is status post hysterectomy.    Menopausal hormone therapy:  estrace Exercising: {yes no:314532}  {types:19826} Smoker:  no  OB History  Gravida Para Term Preterm AB Living  3 3 3  0 0 3  SAB IAB Ectopic Multiple Live Births  0 0 0 0 3    # Outcome Date GA Lbr Len/2nd Weight Sex Type Anes PTL Lv  3 Term 03/08/06    M    LIV  2 Term 09/14/00    F    LIV  1 Term 01/02/92    F    LIV     HEALTH MAINTENANCE: Last 2 paps:  01/12/11 neg History of abnormal Pap or positive HPV:  yes, Years ago. No treatment;repeat pap normal  Mammogram:   07/08/21 Breast Density Cat C, BI-RADS CAT 1 neg Colonoscopy:  n/a Bone Density:  n/a  Result  n/a   Immunization History  Administered Date(s) Administered   Hepatitis A, Adult 09/29/2017   Hepatitis A, Ped/Adol-2 Dose 09/29/2017   PFIZER Comirnaty(Gray Top)Covid-19 Tri-Sucrose Vaccine 11/05/2020   PFIZER(Purple Top)SARS-COV-2 Vaccination 12/20/2019, 01/10/2020   Pneumococcal Polysaccharide-23 09/13/2019   Tdap 09/05/2010, 11/05/2020      reports that she has never smoked. She has never used smokeless tobacco. She reports current alcohol use. She reports that she does not use drugs.  Past Medical History:  Diagnosis Date   Anemia    Hx blood transfusion 3-83yrs ago and iron infusion last month 03/2012   Anxiety    Blood transfusion without reported diagnosis 2013   Endometriosis    Liver disorder    fatty liver disease   PONV (postoperative nausea and vomiting)    Seasonal allergies    STD (sexually transmitted disease)    Tx'd for Chlamydia at age 40   Thyroid disease     Past Surgical History:  Procedure Laterality Date   ABDOMINAL HYSTERECTOMY     CYSTOSCOPY  05/03/2012   Procedure: CYSTOSCOPY;   Surgeon: Loney Laurence, MD;  Location: [facility];  Service: Gynecology;  Laterality: N/A;   DIAGNOSTIC LAPAROSCOPY     SALPINGOOPHORECTOMY  05/03/2012   Procedure: SALPINGO OOPHERECTOMY;  Surgeon: Loney Laurence, MD;  Location: [facility];  Service: Gynecology;  Laterality: Bilateral;   THYROID SURGERY     removed    Current Outpatient Medications  Medication Sig Dispense Refill   Albuterol Sulfate 108 (90 Base) MCG/ACT AEPB Inhale into the lungs as needed.     DULoxetine (CYMBALTA) 60 MG capsule Take by mouth.     estradiol (ESTRACE) 0.5 MG tablet Take 1 tablet (0.5 mg total) by mouth daily. 90 tablet 0   ibuprofen (ADVIL) 600 MG tablet Take 600 mg by mouth every 6 (six) hours as needed.     naproxen (NAPROSYN) 500 MG tablet Take 500 mg by mouth 2 (two) times daily.     thyroid (ARMOUR) 60 MG tablet Take 60 mg by mouth daily before breakfast. Takes Monday- Friday     thyroid (ARMOUR) 90 MG tablet Takes Sat & Sun     No current facility-administered medications for this visit.    ALLERGIES: Topiramate and Vicodin [hydrocodone-acetaminophen]  Family History  Problem Relation Age of Onset  Hypertension Mother    Kawasaki disease Brother        Dec 9 mo.   Thyroid disease Sister    Breast cancer Paternal Grandmother    COPD Paternal Grandmother        dec   Emphysema Paternal Grandmother    Hypertension Maternal Grandmother    Diabetes Maternal Grandmother    Diabetes Paternal Grandfather     Review of Systems  PHYSICAL EXAM:  LMP 04/05/2012 (Approximate)     General appearance: alert, cooperative and appears stated age Head: normocephalic, without obvious abnormality, atraumatic Neck: no adenopathy, supple, symmetrical, trachea midline and thyroid normal to inspection and palpation Lungs: clear to auscultation bilaterally Breasts: normal appearance, no masses or tenderness, No nipple retraction or dimpling, No nipple discharge or bleeding, No axillary adenopathy Heart:  regular rate and rhythm Abdomen: soft, non-tender; no masses, no organomegaly Extremities: extremities normal, atraumatic, no cyanosis or edema Skin: skin color, texture, turgor normal. No rashes or lesions Lymph nodes: cervical, supraclavicular, and axillary nodes normal. Neurologic: grossly normal  Pelvic: External genitalia:  no lesions              No abnormal inguinal nodes palpated.              Urethra:  normal appearing urethra with no masses, tenderness or lesions              Bartholins and Skenes: normal                 Vagina: normal appearing vagina with normal color and discharge, no lesions              Cervix: no lesions              Pap taken: {yes no:314532} Bimanual Exam:  Uterus:  normal size, contour, position, consistency, mobility, non-tender              Adnexa: no mass, fullness, tenderness              Rectal exam: {yes no:314532}.  Confirms.              Anus:  normal sphincter tone, no lesions  Chaperone was present for exam:  {BSCHAPERONE:31226::"Kadijah Shamoon F, CMA"}  ASSESSMENT: Well woman visit with gynecologic exam  ***  PLAN: Mammogram screening discussed. Self breast awareness reviewed. Pap and HRV collected:  {yes no:314532} Guidelines for Calcium, Vitamin D, regular exercise program including cardiovascular and weight bearing exercise. Medication refills:  *** {LABS (Optional):23779} Follow up:  ***    Additional counseling given.  {yes [id number]. ***  total time was spent for this patient encounter, including preparation, face-to-face counseling with the patient, coordination of care, and documentation of the encounter in addition to doing the well woman visit with gynecologic exam.

## 2023-08-07 ENCOUNTER — Ambulatory Visit: Payer: Managed Care, Other (non HMO) | Admitting: Obstetrics and Gynecology

## 2023-08-09 ENCOUNTER — Ambulatory Visit: Payer: Managed Care, Other (non HMO) | Admitting: Obstetrics and Gynecology

## 2023-08-09 NOTE — Progress Notes (Unsigned)
 47 y.o. G54P3003 Married Caucasian female here for annual exam.    PCP: Harvie Heck, MD   Patient's last menstrual period was 04/05/2012 (approximate).           Sexually active: Yes.    The current method of family planning is status post hysterectomy.    Menopausal hormone therapy:  estrace Exercising: {yes no:314532}  {types:19826} Smoker:  no  OB History  Gravida Para Term Preterm AB Living  3 3 3  0 0 3  SAB IAB Ectopic Multiple Live Births  0 0 0 0 3    # Outcome Date GA Lbr Len/2nd Weight Sex Type Anes PTL Lv  3 Term 03/08/06    M    LIV  2 Term 09/14/00    F    LIV  1 Term 01/02/92    F    LIV     HEALTH MAINTENANCE: Last 2 paps:  01/12/11 neg History of abnormal Pap or positive HPV:  yes, Years ago. No treatment;repeat pap normal  Mammogram:   07/08/21 Breast Density Cat C, BI-RADS CAT 1 neg Colonoscopy:  n/a Bone Density:  n/a  Result  n/a   Immunization History  Administered Date(s) Administered   Hepatitis A, Adult 09/29/2017   Hepatitis A, Ped/Adol-2 Dose 09/29/2017   PFIZER Comirnaty(Gray Top)Covid-19 Tri-Sucrose Vaccine 11/05/2020   PFIZER(Purple Top)SARS-COV-2 Vaccination 12/20/2019, 01/10/2020   Pneumococcal Polysaccharide-23 09/13/2019   Tdap 09/05/2010, 11/05/2020      reports that she has never smoked. She has never used smokeless tobacco. She reports current alcohol use. She reports that she does not use drugs.  Past Medical History:  Diagnosis Date   Anemia    Hx blood transfusion 3-83yrs ago and iron infusion last month 03/2012   Anxiety    Blood transfusion without reported diagnosis 2013   Endometriosis    Liver disorder    fatty liver disease   PONV (postoperative nausea and vomiting)    Seasonal allergies    STD (sexually transmitted disease)    Tx'd for Chlamydia at age 40   Thyroid disease     Past Surgical History:  Procedure Laterality Date   ABDOMINAL HYSTERECTOMY     CYSTOSCOPY  05/03/2012   Procedure: CYSTOSCOPY;   Surgeon: Loney Laurence, MD;  Location: [facility];  Service: Gynecology;  Laterality: N/A;   DIAGNOSTIC LAPAROSCOPY     SALPINGOOPHORECTOMY  05/03/2012   Procedure: SALPINGO OOPHERECTOMY;  Surgeon: Loney Laurence, MD;  Location: [facility];  Service: Gynecology;  Laterality: Bilateral;   THYROID SURGERY     removed    Current Outpatient Medications  Medication Sig Dispense Refill   Albuterol Sulfate 108 (90 Base) MCG/ACT AEPB Inhale into the lungs as needed.     DULoxetine (CYMBALTA) 60 MG capsule Take by mouth.     estradiol (ESTRACE) 0.5 MG tablet Take 1 tablet (0.5 mg total) by mouth daily. 90 tablet 0   ibuprofen (ADVIL) 600 MG tablet Take 600 mg by mouth every 6 (six) hours as needed.     naproxen (NAPROSYN) 500 MG tablet Take 500 mg by mouth 2 (two) times daily.     thyroid (ARMOUR) 60 MG tablet Take 60 mg by mouth daily before breakfast. Takes Monday- Friday     thyroid (ARMOUR) 90 MG tablet Takes Sat & Sun     No current facility-administered medications for this visit.    ALLERGIES: Topiramate and Vicodin [hydrocodone-acetaminophen]  Family History  Problem Relation Age of Onset  Hypertension Mother    Kawasaki disease Brother        Dec 9 mo.   Thyroid disease Sister    Breast cancer Paternal Grandmother    COPD Paternal Grandmother        dec   Emphysema Paternal Grandmother    Hypertension Maternal Grandmother    Diabetes Maternal Grandmother    Diabetes Paternal Grandfather     Review of Systems  PHYSICAL EXAM:  LMP 04/05/2012 (Approximate)     General appearance: alert, cooperative and appears stated age Head: normocephalic, without obvious abnormality, atraumatic Neck: no adenopathy, supple, symmetrical, trachea midline and thyroid normal to inspection and palpation Lungs: clear to auscultation bilaterally Breasts: normal appearance, no masses or tenderness, No nipple retraction or dimpling, No nipple discharge or bleeding, No axillary adenopathy Heart:  regular rate and rhythm Abdomen: soft, non-tender; no masses, no organomegaly Extremities: extremities normal, atraumatic, no cyanosis or edema Skin: skin color, texture, turgor normal. No rashes or lesions Lymph nodes: cervical, supraclavicular, and axillary nodes normal. Neurologic: grossly normal  Pelvic: External genitalia:  no lesions              No abnormal inguinal nodes palpated.              Urethra:  normal appearing urethra with no masses, tenderness or lesions              Bartholins and Skenes: normal                 Vagina: normal appearing vagina with normal color and discharge, no lesions              Cervix: no lesions              Pap taken: {yes no:314532} Bimanual Exam:  Uterus:  normal size, contour, position, consistency, mobility, non-tender              Adnexa: no mass, fullness, tenderness              Rectal exam: {yes no:314532}.  Confirms.              Anus:  normal sphincter tone, no lesions  Chaperone was present for exam:  {BSCHAPERONE:31226::"Kadijah Shamoon F, CMA"}  ASSESSMENT: Well woman visit with gynecologic exam  ***  PLAN: Mammogram screening discussed. Self breast awareness reviewed. Pap and HRV collected:  {yes no:314532} Guidelines for Calcium, Vitamin D, regular exercise program including cardiovascular and weight bearing exercise. Medication refills:  *** {LABS (Optional):23779} Follow up:  ***    Additional counseling given.  {yes [id number]. ***  total time was spent for this patient encounter, including preparation, face-to-face counseling with the patient, coordination of care, and documentation of the encounter in addition to doing the well woman visit with gynecologic exam.

## 2023-08-21 ENCOUNTER — Other Ambulatory Visit: Payer: Self-pay

## 2023-08-21 MED ORDER — ESTRADIOL 0.5 MG PO TABS: 0.5000 mg | ORAL_TABLET | Freq: Every day | ORAL | 0 refills | Status: DC

## 2023-08-21 NOTE — Telephone Encounter (Signed)
Medication refill request: estradiol 0.5mg  Last AEX:  05-10-22 Next AEX: not scheduled. Message sent to scheduling department to call her to schedule Last MMG (if hormonal medication request): 04-11-23 birads 2:neg (care everywhere) Refill authorized: please approve if appropriate

## 2023-09-20 ENCOUNTER — Ambulatory Visit: Payer: Managed Care, Other (non HMO) | Admitting: Obstetrics and Gynecology

## 2023-11-22 ENCOUNTER — Ambulatory Visit: Payer: Managed Care, Other (non HMO) | Admitting: Obstetrics and Gynecology

## 2023-11-27 ENCOUNTER — Other Ambulatory Visit: Payer: Self-pay | Admitting: Obstetrics and Gynecology

## 2023-11-27 NOTE — Telephone Encounter (Signed)
 Med refill request: estradiol Last AEX: 05/10/2022 Next AEX: not scheduled, sent message to front desk to schedule patient for annual visit Last MMG (if hormonal med) Refill authorized: Last Rx sent #90 with zero refills on 08/21/2023. Please approve or deny as appropriate.

## 2024-02-01 NOTE — Progress Notes (Signed)
 48 y.o. G45P3003 Married Caucasian female here for annual exam.    Weight gain.  Some emotional eating.  Has tried Phentermine and Saxenda.   Has attention deficit.  Not treating.   On ERT.  Feels hot all the time.   Not sexually active.   Husband has ETOH dependence.   PCP: Nash Bade, MD   Patient's last menstrual period was 04/05/2012 (approximate).           Sexually active: Yes.    The current method of family planning is status post hysterectomy.    Menopausal hormone therapy:  Estrace  Exercising: Yes.    Walking Smoker:  no  OB History  Gravida Para Term Preterm AB Living  3 3 3  0 0 3  SAB IAB Ectopic Multiple Live Births  0 0 0 0 3    # Outcome Date GA Lbr Len/2nd Weight Sex Type Anes PTL Lv  3 Term 03/08/06    M    LIV  2 Term 09/14/00    F    LIV  1 Term 01/02/92    F    LIV     HEALTH MAINTENANCE: Last 2 paps:  01/12/11 neg  History of abnormal Pap or positive HPV:  no Mammogram:   04/11/23 [facility] Cat 2 benign Care Everywhere  Colonoscopy:  08/28/18 Care Everywhere Bone Density:  n/a  Result  n/a   Immunization History  Administered Date(s) Administered   Hepatitis A, Adult 09/29/2017   Hepatitis A, Ped/Adol-2 Dose 09/29/2017   PFIZER Comirnaty(Gray Top)Covid-19 Tri-Sucrose Vaccine 11/05/2020   PFIZER(Purple Top)SARS-COV-2 Vaccination 12/20/2019, 01/10/2020   Pneumococcal Polysaccharide-23 09/13/2019   Tdap 09/05/2010, 11/05/2020      reports that she has never smoked. She has never used smokeless tobacco. She reports current alcohol use. She reports that she does not use drugs.  Past Medical History:  Diagnosis Date   Anemia    Hx blood transfusion 3-41yrs ago and iron infusion last month 03/2012   Anxiety    Blood transfusion without reported diagnosis 2013   Endometriosis    Liver disorder    fatty liver disease   PONV (postoperative nausea and vomiting)    Seasonal allergies    STD (sexually transmitted disease)    Tx'd for  Chlamydia at age 54   Thyroid  disease     Past Surgical History:  Procedure Laterality Date   ABDOMINAL HYSTERECTOMY     CYSTOSCOPY  05/03/2012   Procedure: CYSTOSCOPY;  Surgeon: Oddis Bench, MD;  Location: [facility];  Service: Gynecology;  Laterality: N/A;   DIAGNOSTIC LAPAROSCOPY     SALPINGOOPHORECTOMY  05/03/2012   Procedure: SALPINGO OOPHERECTOMY;  Surgeon: Oddis Bench, MD;  Location: [facility];  Service: Gynecology;  Laterality: Bilateral;   THYROID  SURGERY     removed    Current Outpatient Medications  Medication Sig Dispense Refill   Albuterol  Sulfate 108 (90 Base) MCG/ACT AEPB Inhale into the lungs as needed.     DULoxetine (CYMBALTA) 60 MG capsule Take by mouth.     estradiol  (ESTRACE ) 0.5 MG tablet TAKE 1 TABLET BY MOUTH EVERY DAY 90 tablet 0   ibuprofen  (ADVIL ) 600 MG tablet Take 600 mg by mouth every 6 (six) hours as needed.     levothyroxine  (SYNTHROID ) 25 MCG tablet Take 25 mcg by mouth.     naproxen (NAPROSYN) 500 MG tablet Take 500 mg by mouth 2 (two) times daily.     thyroid  (ARMOUR) 60 MG tablet Take 60  mg by mouth daily before breakfast. Takes Monday- Friday     No current facility-administered medications for this visit.    ALLERGIES: Topiramate and Vicodin [hydrocodone-acetaminophen ]  Family History  Problem Relation Age of Onset   Hypertension Mother    Kawasaki disease Brother        Dec 9 mo.   Thyroid  disease Sister    Breast cancer Paternal Grandmother    COPD Paternal Grandmother        dec   Emphysema Paternal Grandmother    Hypertension Maternal Grandmother    Diabetes Maternal Grandmother    Diabetes Paternal Grandfather     Review of Systems  All other systems reviewed and are negative.   PHYSICAL EXAM:  BP 120/84 (BP Location: Left Arm, Patient Position: Sitting)   Pulse 73   Ht 5\' 4"  (1.626 m)   Wt 215 lb (97.5 kg)   LMP 04/05/2012 (Approximate)   SpO2 98%   BMI 36.90 kg/m     General appearance: alert, cooperative  and appears stated age Head: normocephalic, without obvious abnormality, atraumatic Neck: no adenopathy, supple, symmetrical, trachea midline and thyroid  normal to inspection and palpation Lungs: clear to auscultation bilaterally Breasts: normal appearance, no masses or tenderness, No nipple retraction or dimpling, No nipple discharge or bleeding, No axillary adenopathy Heart: regular rate and rhythm Abdomen: soft, non-tender; no masses, no organomegaly Extremities: extremities normal, atraumatic, no cyanosis or edema Skin: skin color, texture, turgor normal. No rashes or lesions Lymph nodes: cervical, supraclavicular, and axillary nodes normal. Neurologic: grossly normal  Pelvic: External genitalia:  no lesions              No abnormal inguinal nodes palpated.              Urethra:  normal appearing urethra with no masses, tenderness or lesions              Bartholins and Skenes: normal                 Vagina: normal appearing vagina with normal color and discharge, no lesions              Cervix: absent              Pap taken: no Bimanual Exam:  Uterus:  absent              Adnexa: no mass, fullness, tenderness              Rectal exam: yes.  Confirms.              Anus:  normal sphincter tone, no lesions  Chaperone was present for exam:  Cottie Diss, CMA  ASSESSMENT: Well woman visit with gynecologic exam. Status post TAH/BSO. ERT.  Fatty liver disease.  Last LFTs normal in Epic. Hx voiding hesitancy.  On Cymbalta. Hypothyroidism.  PHQ-9: 0  PLAN: Mammogram screening discussed. Self breast awareness reviewed. Pap and HRV collected:  no.  Not indicated.  Guidelines for Calcium, Vitamin D, regular exercise program including cardiovascular and weight bearing exercise. We discussed ERT and increased risk of stroke, PE, and DVT>  Medication refills:  increase Estradiol  to 1 mg daily.  She may pursue counseling and tx for weight loss and ADD. Follow up:  yearly and prn.

## 2024-02-05 ENCOUNTER — Ambulatory Visit (INDEPENDENT_AMBULATORY_CARE_PROVIDER_SITE_OTHER): Admitting: Obstetrics and Gynecology

## 2024-02-05 ENCOUNTER — Encounter: Payer: Self-pay | Admitting: Obstetrics and Gynecology

## 2024-02-05 VITALS — BP 120/84 | HR 73 | Ht 64.0 in | Wt 215.0 lb

## 2024-02-05 DIAGNOSIS — Z1331 Encounter for screening for depression: Secondary | ICD-10-CM | POA: Diagnosis not present

## 2024-02-05 DIAGNOSIS — Z01419 Encounter for gynecological examination (general) (routine) without abnormal findings: Secondary | ICD-10-CM | POA: Diagnosis not present

## 2024-02-05 MED ORDER — ESTRADIOL 1 MG PO TABS: 1.0000 mg | ORAL_TABLET | Freq: Every day | ORAL | 3 refills | Status: AC

## 2024-02-05 NOTE — Patient Instructions (Signed)

## 2025-02-05 ENCOUNTER — Ambulatory Visit: Admitting: Obstetrics and Gynecology
